# Patient Record
Sex: Male | Born: 1945 | Race: White | Hispanic: No | Marital: Married | Smoking: Never smoker
Health system: Southern US, Community
[De-identification: ages and names within clinical notes are randomized; demographics above are authoritative.]

## PROBLEM LIST (undated history)

## (undated) DIAGNOSIS — A4902 Methicillin resistant Staphylococcus aureus infection, unspecified site: Secondary | ICD-10-CM

## (undated) DIAGNOSIS — Z789 Other specified health status: Secondary | ICD-10-CM

## (undated) DIAGNOSIS — Z9289 Personal history of other medical treatment: Secondary | ICD-10-CM

## (undated) DIAGNOSIS — E781 Pure hyperglyceridemia: Secondary | ICD-10-CM

## (undated) DIAGNOSIS — Z85820 Personal history of malignant melanoma of skin: Secondary | ICD-10-CM

## (undated) DIAGNOSIS — M1990 Unspecified osteoarthritis, unspecified site: Secondary | ICD-10-CM

## (undated) DIAGNOSIS — I8393 Asymptomatic varicose veins of bilateral lower extremities: Secondary | ICD-10-CM

## (undated) HISTORY — DX: Unspecified osteoarthritis, unspecified site: M19.90

## (undated) HISTORY — PX: CATARACT EXTRACTION W/ INTRAOCULAR LENS IMPLANT: SHX1309

## (undated) HISTORY — DX: Pure hyperglyceridemia: E78.1

## (undated) HISTORY — DX: Asymptomatic varicose veins of bilateral lower extremities: I83.93

## (undated) HISTORY — DX: Personal history of malignant melanoma of skin: Z85.820

## (undated) HISTORY — DX: Methicillin resistant Staphylococcus aureus infection, unspecified site: A49.02

---
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
## 1995-10-25 HISTORY — PX: HERNIA REPAIR: SHX51

## 2004-10-24 DIAGNOSIS — C801 Malignant (primary) neoplasm, unspecified: Secondary | ICD-10-CM

## 2004-10-24 HISTORY — DX: Malignant (primary) neoplasm, unspecified: C80.1

## 2004-10-24 HISTORY — PX: MELANOMA EXCISION: SHX5266

## 2016-02-18 DIAGNOSIS — R6889 Other general symptoms and signs: Secondary | ICD-10-CM | POA: Diagnosis not present

## 2016-02-18 DIAGNOSIS — J309 Allergic rhinitis, unspecified: Secondary | ICD-10-CM | POA: Diagnosis not present

## 2016-02-18 DIAGNOSIS — R5383 Other fatigue: Secondary | ICD-10-CM | POA: Diagnosis not present

## 2016-02-18 DIAGNOSIS — Z13220 Encounter for screening for lipoid disorders: Secondary | ICD-10-CM | POA: Diagnosis not present

## 2016-02-18 DIAGNOSIS — J342 Deviated nasal septum: Secondary | ICD-10-CM | POA: Diagnosis not present

## 2016-02-18 DIAGNOSIS — Z125 Encounter for screening for malignant neoplasm of prostate: Secondary | ICD-10-CM | POA: Diagnosis not present

## 2016-09-12 DIAGNOSIS — Z23 Encounter for immunization: Secondary | ICD-10-CM | POA: Diagnosis not present

## 2017-02-17 DIAGNOSIS — R5383 Other fatigue: Secondary | ICD-10-CM | POA: Diagnosis not present

## 2017-02-17 DIAGNOSIS — Z1389 Encounter for screening for other disorder: Secondary | ICD-10-CM | POA: Diagnosis not present

## 2017-02-17 DIAGNOSIS — Z85820 Personal history of malignant melanoma of skin: Secondary | ICD-10-CM | POA: Diagnosis not present

## 2017-02-17 DIAGNOSIS — Z0000 Encounter for general adult medical examination without abnormal findings: Secondary | ICD-10-CM | POA: Diagnosis not present

## 2017-02-17 DIAGNOSIS — Z125 Encounter for screening for malignant neoplasm of prostate: Secondary | ICD-10-CM | POA: Diagnosis not present

## 2017-02-17 DIAGNOSIS — Z23 Encounter for immunization: Secondary | ICD-10-CM | POA: Diagnosis not present

## 2017-02-17 DIAGNOSIS — E781 Pure hyperglyceridemia: Secondary | ICD-10-CM | POA: Diagnosis not present

## 2017-03-14 DIAGNOSIS — B351 Tinea unguium: Secondary | ICD-10-CM | POA: Diagnosis not present

## 2017-03-14 DIAGNOSIS — L570 Actinic keratosis: Secondary | ICD-10-CM | POA: Diagnosis not present

## 2017-03-14 DIAGNOSIS — L820 Inflamed seborrheic keratosis: Secondary | ICD-10-CM | POA: Diagnosis not present

## 2017-03-14 DIAGNOSIS — Z85820 Personal history of malignant melanoma of skin: Secondary | ICD-10-CM | POA: Diagnosis not present

## 2017-06-14 DIAGNOSIS — Z1211 Encounter for screening for malignant neoplasm of colon: Secondary | ICD-10-CM | POA: Diagnosis not present

## 2017-06-14 DIAGNOSIS — Z1212 Encounter for screening for malignant neoplasm of rectum: Secondary | ICD-10-CM | POA: Diagnosis not present

## 2017-08-19 DIAGNOSIS — Z23 Encounter for immunization: Secondary | ICD-10-CM | POA: Diagnosis not present

## 2017-09-12 DIAGNOSIS — Z23 Encounter for immunization: Secondary | ICD-10-CM | POA: Diagnosis not present

## 2018-05-31 DIAGNOSIS — E669 Obesity, unspecified: Secondary | ICD-10-CM | POA: Diagnosis not present

## 2018-05-31 DIAGNOSIS — Z6831 Body mass index (BMI) 31.0-31.9, adult: Secondary | ICD-10-CM | POA: Diagnosis not present

## 2018-05-31 DIAGNOSIS — Z0001 Encounter for general adult medical examination with abnormal findings: Secondary | ICD-10-CM | POA: Diagnosis not present

## 2018-05-31 DIAGNOSIS — Z23 Encounter for immunization: Secondary | ICD-10-CM | POA: Diagnosis not present

## 2018-05-31 DIAGNOSIS — E781 Pure hyperglyceridemia: Secondary | ICD-10-CM | POA: Diagnosis not present

## 2018-05-31 DIAGNOSIS — R05 Cough: Secondary | ICD-10-CM | POA: Diagnosis not present

## 2018-05-31 DIAGNOSIS — Z85820 Personal history of malignant melanoma of skin: Secondary | ICD-10-CM | POA: Diagnosis not present

## 2018-05-31 DIAGNOSIS — Z1331 Encounter for screening for depression: Secondary | ICD-10-CM | POA: Diagnosis not present

## 2018-05-31 DIAGNOSIS — Z1389 Encounter for screening for other disorder: Secondary | ICD-10-CM | POA: Diagnosis not present

## 2018-05-31 DIAGNOSIS — Z1159 Encounter for screening for other viral diseases: Secondary | ICD-10-CM | POA: Diagnosis not present

## 2018-05-31 DIAGNOSIS — Z125 Encounter for screening for malignant neoplasm of prostate: Secondary | ICD-10-CM | POA: Diagnosis not present

## 2018-09-14 DIAGNOSIS — Z23 Encounter for immunization: Secondary | ICD-10-CM | POA: Diagnosis not present

## 2018-10-24 DIAGNOSIS — I499 Cardiac arrhythmia, unspecified: Secondary | ICD-10-CM

## 2018-10-24 HISTORY — DX: Cardiac arrhythmia, unspecified: I49.9

## 2019-10-22 DIAGNOSIS — E781 Pure hyperglyceridemia: Secondary | ICD-10-CM | POA: Diagnosis not present

## 2019-10-22 DIAGNOSIS — Z0001 Encounter for general adult medical examination with abnormal findings: Secondary | ICD-10-CM | POA: Diagnosis not present

## 2019-10-22 DIAGNOSIS — I499 Cardiac arrhythmia, unspecified: Secondary | ICD-10-CM | POA: Diagnosis not present

## 2019-10-22 DIAGNOSIS — Z85820 Personal history of malignant melanoma of skin: Secondary | ICD-10-CM | POA: Diagnosis not present

## 2019-10-22 DIAGNOSIS — Z7189 Other specified counseling: Secondary | ICD-10-CM | POA: Diagnosis not present

## 2019-10-25 HISTORY — PX: OTHER SURGICAL HISTORY: SHX169

## 2019-11-18 NOTE — Progress Notes (Signed)
Cardiology Office Note:    Date:  11/19/2019   ID:  Chase Knapp, DOB April 26, 1946, MRN [id number]  PCP:  Lind Guest, NP  Cardiologist:  Shirlee More, MD    Referring MD: Lind Guest, NP    ASSESSMENT:    1. Persistent atrial fibrillation (HCC)    PLAN:    In order of problems listed above:  1. He has age-indeterminate atrial fibrillation is asymptomatic.  Although he is risk score is relatively low at 1 it still favors anticoagulation and after review of options and benefits elects to start Xarelto once daily.  TSH is normal echocardiogram ordered I will review it if his atria are normal size we could consider an attempt to reestablish sinus rhythm but I have to be honest with the man I do not think he would feel better and would not improve the quality of his life and the likelihood of long-term success in these situations is low.  I encouraged him to continue his other health habits no restrictions in his exercise with cycling. 2. For triglyceridemia his recent lipid profile shows a normal triglyceride level.  This time would not advise pharmacologic therapy   Next appointment: 6 weeks   Medication Adjustments/Labs and Tests Ordered: Current medicines are reviewed at length with the patient today.  Concerns regarding medicines are outlined above.  No orders of the defined types were placed in this encounter.  No orders of the defined types were placed in this encounter.   No chief complaint on file.   History of Present Illness:    Chase Knapp is a 74 y.o. male with a hx of recently identified atrial fibrillation with a controlled ventricular rate on EKG 10/22/2019 referred by Haydee Salter nurse practitioner.  His stroke risk is low and the only identifiable risk factor is his age. Compliance with diet, lifestyle and medications: Yes  He retired early life and is very vigorous active man and an avid cyclist.  He has noticed no change in his exercise tolerance  palpitations syncope TIA shortness of breath or edema and no history of heart disease congenital or rheumatic.  He has good healthcare literacy I reviewed with him his increased risk of stroke and current recommendations favor anticoagulation over antiplatelet.  He agrees and will start once a day Xarelto.  Rate studies are normal no alcohol abuse or sleep apnea and he will undergo echocardiogram and reassess in the office in 6 weeks Past Medical History:  Diagnosis Date  . History of melanoma   . Hypertriglyceridemia   . MRSA infection    Right knee prolonged IV antibiotics  . Osteoarthritis   . Varicose veins of both lower extremities     Past Surgical History:  Procedure Laterality Date  . APPENDECTOMY    . HERNIA REPAIR      Current Medications: No outpatient medications have been marked as taking for the 11/19/19 encounter (Office Visit) with Richardo Priest, MD.     Allergies:   Patient has no known allergies.   Social History   Socioeconomic History  . Marital status: Unknown    Spouse name: Not on file  . Number of children: Not on file  . Years of education: Not on file  . Highest education level: Not on file  Occupational History  . Not on file  Tobacco Use  . Smoking status: Never Smoker  . Smokeless tobacco: Never Used  Substance and Sexual Activity  . Alcohol use: Never  .  Drug use: Not on file  . Sexual activity: Not on file  Other Topics Concern  . Not on file  Social History Narrative  . Not on file   Social Determinants of Health   Financial Resource Strain:   . Difficulty of Paying Living Expenses: Not on file  Food Insecurity:   . Worried About Charity fundraiser in the Last Year: Not on file  . Ran Out of Food in the Last Year: Not on file  Transportation Needs:   . Lack of Transportation (Medical): Not on file  . Lack of Transportation (Non-Medical): Not on file  Physical Activity:   . Days of Exercise per Week: Not on file  . Minutes of  Exercise per Session: Not on file  Stress:   . Feeling of Stress : Not on file  Social Connections:   . Frequency of Communication with Friends and Family: Not on file  . Frequency of Social Gatherings with Friends and Family: Not on file  . Attends Religious Services: Not on file  . Active Member of Clubs or Organizations: Not on file  . Attends Archivist Meetings: Not on file  . Marital Status: Not on file     Family History:  Family history of atrial fibrillation ROS: Review of Systems  Constitution: Negative.  HENT: Negative.   Eyes: Negative.   Cardiovascular: Negative.   Respiratory: Negative.   Endocrine: Negative.   Hematologic/Lymphatic: Negative.   Skin: Negative.   Musculoskeletal: Negative.   Gastrointestinal: Negative.   Genitourinary: Negative.   Neurological: Negative.   Psychiatric/Behavioral: Negative.   Allergic/Immunologic: Negative.     Please see the history of present illness.    All other systems reviewed and are negative.  EKGs/Labs/Other Studies Reviewed:    The following studies were reviewed today:  EKG:  EKG ordered today and personally reviewed.  The ekg ordered today demonstrates atrial fibrillation controlled ventricular rate otherwise normal EKG  Recent Labs: Supplied by his primary care physician: TSH normal 0.76 Hemoglobin 14.3 platelets 179,000 CMP is normal including liver function renal function potassium 4.7 creatinine 0.96 GFR 78 cc Cholesterol 210 triglycerides 119 HDL 47 LDL 142  Physical Exam:    VS:  BP 128/84   Pulse 79   Ht 5' 7.5" (1.715 m)   Wt 209 lb (94.8 kg)   SpO2 95%   BMI 32.25 kg/m     Wt Readings from Last 3 Encounters:  11/19/19 209 lb (94.8 kg)     GEN: He looks younger than his age quite healthy appearing well nourished, well developed in no acute distress HEENT: Normal NECK: No JVD; No carotid bruits LYMPHATICS: No lymphadenopathy CARDIAC: Irregular rhythm S1 variable , no murmurs,  rubs, gallops RESPIRATORY:  Clear to auscultation without rales, wheezing or rhonchi  ABDOMEN: Soft, non-tender, non-distended MUSCULOSKELETAL:  No edema; No deformity  SKIN: Warm and dry NEUROLOGIC:  Alert and oriented x 3 PSYCHIATRIC:  Normal affect    Signed, Shirlee More, MD  11/19/2019 1:46 PM     [facility]

## 2019-11-19 ENCOUNTER — Ambulatory Visit (INDEPENDENT_AMBULATORY_CARE_PROVIDER_SITE_OTHER): Payer: Medicare Other | Admitting: Cardiology

## 2019-11-19 ENCOUNTER — Encounter: Payer: Self-pay | Admitting: Cardiology

## 2019-11-19 ENCOUNTER — Other Ambulatory Visit: Payer: Self-pay

## 2019-11-19 VITALS — BP 128/84 | HR 79 | Ht 67.5 in | Wt 209.0 lb

## 2019-11-19 DIAGNOSIS — I4819 Other persistent atrial fibrillation: Secondary | ICD-10-CM | POA: Diagnosis not present

## 2019-11-19 MED ORDER — RIVAROXABAN 20 MG PO TABS: 20.0000 mg | ORAL_TABLET | Freq: Every day | ORAL | 3 refills | Status: DC

## 2019-11-19 NOTE — Patient Instructions (Signed)
Medication Instructions:  START Rivaroxaban (XARELTO) 20 mg once day   *If you need a refill on your cardiac medications before your next appointment, please call your pharmacy*  Lab Work: None ordered   If you have labs (blood work) drawn today and your tests are completely normal, you will receive your results only by: Marland Kitchen MyChart Message (if you have MyChart) OR . A paper copy in the mail If you have any lab test that is abnormal or we need to change your treatment, we will call you to review the results.  Testing/Procedures: Your physician has requested that you have an echocardiogram. Echocardiography is a painless test that uses sound waves to create images of your heart. It provides your doctor with information about the size and shape of your heart and how well your heart's chambers and valves are working. This procedure takes approximately one hour. There are no restrictions for this procedure.    Follow-Up: At [facility], you and your health needs are our priority.  As part of our continuing mission to provide you with exceptional heart care, we have created designated Provider Care Teams.  These Care Teams include your primary Cardiologist (physician) and Advanced Practice Providers (APPs -  Physician Assistants and Nurse Practitioners) who all work together to provide you with the care you need, when you need it.  Your next appointment:   6 week(s)  The format for your next appointment:   In Person  Provider:   Shirlee More, MD  Other Instructions None

## 2019-11-20 ENCOUNTER — Other Ambulatory Visit: Payer: Self-pay | Admitting: Cardiology

## 2019-11-20 MED ORDER — RIVAROXABAN 20 MG PO TABS: 20.0000 mg | ORAL_TABLET | Freq: Every day | ORAL | 3 refills | Status: DC

## 2019-11-20 NOTE — Telephone Encounter (Signed)
PATIENT came into office and needs meds called to another pharmacy, he gave the wrong one.Marland Kitchen He needs to have called to the [facility] on [address] in [place], please call in any  Prescriptions that we sent yesterday to the [facility] for him and change the pharmacy preference to them also.

## 2019-11-20 NOTE — Telephone Encounter (Signed)
Rx refill sent to pharmacy. Refill sent to new pharmacy, other was sent to wrong pharmacy.

## 2019-11-20 NOTE — Addendum Note (Signed)
Addended by: Anselm Pancoast on: 11/20/2019 04:58 PM   Modules accepted: Orders

## 2020-01-07 ENCOUNTER — Other Ambulatory Visit: Payer: Medicare Other

## 2020-01-10 ENCOUNTER — Ambulatory Visit: Payer: Medicare Other | Admitting: Cardiology

## 2020-01-15 DIAGNOSIS — I371 Nonrheumatic pulmonary valve insufficiency: Secondary | ICD-10-CM | POA: Diagnosis not present

## 2020-01-15 DIAGNOSIS — I4891 Unspecified atrial fibrillation: Secondary | ICD-10-CM | POA: Diagnosis not present

## 2020-01-15 DIAGNOSIS — I499 Cardiac arrhythmia, unspecified: Secondary | ICD-10-CM | POA: Diagnosis not present

## 2020-01-15 DIAGNOSIS — I517 Cardiomegaly: Secondary | ICD-10-CM | POA: Diagnosis not present

## 2020-02-07 DIAGNOSIS — Z7901 Long term (current) use of anticoagulants: Secondary | ICD-10-CM | POA: Diagnosis not present

## 2020-02-07 DIAGNOSIS — I4819 Other persistent atrial fibrillation: Secondary | ICD-10-CM | POA: Diagnosis not present

## 2020-02-07 DIAGNOSIS — I4891 Unspecified atrial fibrillation: Secondary | ICD-10-CM | POA: Diagnosis not present

## 2020-02-07 DIAGNOSIS — Z20822 Contact with and (suspected) exposure to covid-19: Secondary | ICD-10-CM | POA: Diagnosis not present

## 2020-02-07 DIAGNOSIS — Z01810 Encounter for preprocedural cardiovascular examination: Secondary | ICD-10-CM | POA: Diagnosis not present

## 2020-02-10 DIAGNOSIS — Z7901 Long term (current) use of anticoagulants: Secondary | ICD-10-CM | POA: Diagnosis not present

## 2020-02-10 DIAGNOSIS — I4819 Other persistent atrial fibrillation: Secondary | ICD-10-CM | POA: Diagnosis not present

## 2020-02-11 DIAGNOSIS — Z9889 Other specified postprocedural states: Secondary | ICD-10-CM | POA: Diagnosis not present

## 2020-02-11 DIAGNOSIS — Z7901 Long term (current) use of anticoagulants: Secondary | ICD-10-CM | POA: Diagnosis not present

## 2020-02-11 DIAGNOSIS — Z8679 Personal history of other diseases of the circulatory system: Secondary | ICD-10-CM | POA: Diagnosis not present

## 2020-02-11 DIAGNOSIS — I4891 Unspecified atrial fibrillation: Secondary | ICD-10-CM | POA: Diagnosis not present

## 2020-02-11 DIAGNOSIS — I4819 Other persistent atrial fibrillation: Secondary | ICD-10-CM | POA: Diagnosis not present

## 2020-04-16 DIAGNOSIS — I4819 Other persistent atrial fibrillation: Secondary | ICD-10-CM | POA: Diagnosis not present

## 2020-04-30 DIAGNOSIS — I712 Thoracic aortic aneurysm, without rupture: Secondary | ICD-10-CM | POA: Diagnosis not present

## 2020-04-30 DIAGNOSIS — Z7901 Long term (current) use of anticoagulants: Secondary | ICD-10-CM | POA: Diagnosis not present

## 2020-04-30 DIAGNOSIS — I4819 Other persistent atrial fibrillation: Secondary | ICD-10-CM | POA: Diagnosis not present

## 2020-04-30 DIAGNOSIS — Z01810 Encounter for preprocedural cardiovascular examination: Secondary | ICD-10-CM | POA: Diagnosis not present

## 2020-04-30 DIAGNOSIS — I491 Atrial premature depolarization: Secondary | ICD-10-CM | POA: Diagnosis not present

## 2020-04-30 DIAGNOSIS — I440 Atrioventricular block, first degree: Secondary | ICD-10-CM | POA: Diagnosis not present

## 2020-04-30 DIAGNOSIS — Z8679 Personal history of other diseases of the circulatory system: Secondary | ICD-10-CM | POA: Diagnosis not present

## 2020-04-30 DIAGNOSIS — Z9889 Other specified postprocedural states: Secondary | ICD-10-CM | POA: Diagnosis not present

## 2020-04-30 DIAGNOSIS — I4891 Unspecified atrial fibrillation: Secondary | ICD-10-CM | POA: Diagnosis not present

## 2020-06-27 DIAGNOSIS — I482 Chronic atrial fibrillation, unspecified: Secondary | ICD-10-CM | POA: Diagnosis not present

## 2020-07-05 DIAGNOSIS — I4819 Other persistent atrial fibrillation: Secondary | ICD-10-CM | POA: Diagnosis not present

## 2020-08-13 DIAGNOSIS — I4819 Other persistent atrial fibrillation: Secondary | ICD-10-CM | POA: Diagnosis not present

## 2020-10-20 ENCOUNTER — Other Ambulatory Visit: Payer: Self-pay | Admitting: Cardiology

## 2020-10-22 DIAGNOSIS — Z0001 Encounter for general adult medical examination with abnormal findings: Secondary | ICD-10-CM | POA: Diagnosis not present

## 2020-10-22 DIAGNOSIS — E781 Pure hyperglyceridemia: Secondary | ICD-10-CM | POA: Diagnosis not present

## 2020-11-19 DIAGNOSIS — Z1211 Encounter for screening for malignant neoplasm of colon: Secondary | ICD-10-CM | POA: Diagnosis not present

## 2020-11-19 DIAGNOSIS — Z1212 Encounter for screening for malignant neoplasm of rectum: Secondary | ICD-10-CM | POA: Diagnosis not present

## 2021-02-20 DIAGNOSIS — I4819 Other persistent atrial fibrillation: Secondary | ICD-10-CM | POA: Diagnosis not present

## 2021-03-01 DIAGNOSIS — I4819 Other persistent atrial fibrillation: Secondary | ICD-10-CM | POA: Diagnosis not present

## 2021-03-04 DIAGNOSIS — I712 Thoracic aortic aneurysm, without rupture: Secondary | ICD-10-CM | POA: Diagnosis not present

## 2021-03-04 DIAGNOSIS — I4819 Other persistent atrial fibrillation: Secondary | ICD-10-CM | POA: Diagnosis not present

## 2021-04-07 DIAGNOSIS — M1712 Unilateral primary osteoarthritis, left knee: Secondary | ICD-10-CM | POA: Diagnosis not present

## 2021-04-07 DIAGNOSIS — M170 Bilateral primary osteoarthritis of knee: Secondary | ICD-10-CM | POA: Diagnosis not present

## 2021-07-21 DIAGNOSIS — M1611 Unilateral primary osteoarthritis, right hip: Secondary | ICD-10-CM | POA: Diagnosis not present

## 2021-08-18 DIAGNOSIS — Z8679 Personal history of other diseases of the circulatory system: Secondary | ICD-10-CM | POA: Diagnosis not present

## 2021-08-18 DIAGNOSIS — Z01818 Encounter for other preprocedural examination: Secondary | ICD-10-CM | POA: Diagnosis not present

## 2021-08-18 DIAGNOSIS — M1611 Unilateral primary osteoarthritis, right hip: Secondary | ICD-10-CM | POA: Diagnosis not present

## 2021-09-09 NOTE — Patient Instructions (Addendum)
DUE TO COVID-19 ONLY ONE VISITOR IS ALLOWED TO COME WITH YOU AND STAY IN THE WAITING ROOM ONLY DURING PRE OP AND PROCEDURE DAY OF SURGERY IF YOU ARE GOING HOME AFTER SURGERY. IF YOU ARE SPENDING THE NIGHT 2 PEOPLE MAY VISIT WITH YOU IN YOUR PRIVATE ROOM AFTER SURGERY UNTIL VISITING  HOURS ARE OVER AT 8:00 PM AND 1  VISITOR  MAY  SPEND THE NIGHT.                 Chase Knapp     Your procedure is scheduled on: 09/21/21   Report to [facility] Main  Entrance   Report to admitting at 2:20 PM     Call this number if you have problems the morning of surgery [phone]    No food after midnight.    You may have clear liquid until 1:30 PM.    At 1:00 PM drink pre surgery drink  . Nothing by mouth after 1:30 PM   CLEAR LIQUID DIET   Foods Allowed                                                                     Foods Excluded  Coffee and tea, regular and decaf  No milk or creamer                                                    liquids that you cannot  Plain Jell-O any favor except red or purple                                           see through such as: Fruit ices (not with fruit pulp)                                     milk, soups, orange juice  Iced Popsicles                                    All solid food Carbonated beverages, regular and diet                                    Cranberry, grape and apple juices Sports drinks like Gatorade Lightly seasoned clear broth or consume(fat free) Sugar   BRUSH YOUR TEETH MORNING OF SURGERY AND RINSE YOUR MOUTH OUT, NO CHEWING GUM CANDY OR MINTS.     Take these medicines the morning of surgery with A SIP OF WATER: none                                You may not have any metal on your body including  piercings  Do not wear jewelry,  lotions, powders or deodorant             Men may shave face and neck.   Do not bring valuables to the hospital. [place].  Contacts, dentures or bridgework may not be worn into surgery.              [place] - Preparing for Surgery Before surgery, you can play an important role.  Because skin is not sterile, your skin needs to be as free of germs as possible.  You can reduce the number of germs on your skin by washing with CHG (chlorahexidine gluconate) soap before surgery.  CHG is an antiseptic cleaner which kills germs and bonds with the skin to continue killing germs even after washing. Please DO NOT use if you have an allergy to CHG or antibacterial soaps.  If your skin becomes reddened/irritated stop using the CHG and inform your nurse when you arrive at Short Stay.  You may shave your face/neck. Please follow these instructions carefully:  1.  Shower with CHG Soap the night before surgery and the  morning of Surgery.  2.  If you choose to wash your hair, wash your hair first as usual with your  normal  shampoo.  3.  After you shampoo, rinse your hair and body thoroughly to remove the  shampoo.                            4.  Use CHG as you would any other liquid soap.  You can apply chg directly  to the skin and wash                       Gently with a scrungie or clean washcloth.  5.  Apply the CHG Soap to your body ONLY FROM THE NECK DOWN.   Do not use on face/ open                           Wound or open sores. Avoid contact with eyes, ears mouth and genitals (private parts).                       Wash face,  Genitals (private parts) with your normal soap.             6.  Wash thoroughly, paying special attention to the area where your surgery  will be performed.  7.  Thoroughly rinse your body with warm water from the neck down.  8.  DO NOT shower/wash with your normal soap after using and rinsing off  the CHG Soap.                9.  Pat yourself dry with a clean towel.            10.  Wear clean pajamas.            11.  Place clean sheets on your bed the night of your first shower and do  not  sleep with pets. Day of Surgery : Do not apply any lotions/deodorants the morning of surgery.  Please wear clean clothes to the hospital/surgery center.  FAILURE TO FOLLOW THESE INSTRUCTIONS MAY RESULT IN THE CANCELLATION OF  YOUR SURGERY PATIENT SIGNATURE_________________________________  NURSE SIGNATURE__________________________________  ________________________________________________________________________   Chase Knapp  An incentive spirometer is a tool that can help keep your lungs clear and active. This tool measures how well you are filling your lungs with each breath. Taking long deep breaths may help reverse or decrease the chance of developing breathing (pulmonary) problems (especially infection) following: A long period of time when you are unable to move or be active. BEFORE THE PROCEDURE  If the spirometer includes an indicator to show your best effort, your nurse or respiratory therapist will set it to a desired goal. If possible, sit up straight or lean slightly forward. Try not to slouch. Hold the incentive spirometer in an upright position. INSTRUCTIONS FOR USE  Sit on the edge of your bed if possible, or sit up as far as you can in bed or on a chair. Hold the incentive spirometer in an upright position. Breathe out normally. Place the mouthpiece in your mouth and seal your lips tightly around it. Breathe in slowly and as deeply as possible, raising the piston or the ball toward the top of the column. Hold your breath for 3-5 seconds or for as long as possible. Allow the piston or ball to fall to the bottom of the column. Remove the mouthpiece from your mouth and breathe out normally. Rest for a few seconds and repeat Steps 1 through 7 at least 10 times every 1-2 hours when you are awake. Take your time and take a few normal breaths between deep breaths. The spirometer may include an indicator to show your best effort. Use the indicator as a goal to work  toward during each repetition. After each set of 10 deep breaths, practice coughing to be sure your lungs are clear. If you have an incision (the cut made at the time of surgery), support your incision when coughing by placing a pillow or rolled up towels firmly against it. Once you are able to get out of bed, walk around indoors and cough well. You may stop using the incentive spirometer when instructed by your caregiver.  RISKS AND COMPLICATIONS Take your time so you do not get dizzy or light-headed. If you are in pain, you may need to take or ask for pain medication before doing incentive spirometry. It is harder to take a deep breath if you are having pain. AFTER USE Rest and breathe slowly and easily. It can be helpful to keep track of a log of your progress. Your caregiver can provide you with a simple table to help with this. If you are using the spirometer at home, follow these instructions: [place] IF:  You are having difficultly using the spirometer. You have trouble using the spirometer as often as instructed. Your pain medication is not giving enough relief while using the spirometer. You develop fever of 100.5 F (38.1 C) or higher. SEEK IMMEDIATE MEDICAL CARE IF:  You cough up bloody sputum that had not been present before. You develop fever of 102 F (38.9 C) or greater. You develop worsening pain at or near the incision site. MAKE SURE YOU:  Understand these instructions. Will watch your condition. Will get help right away if you are not doing well or get worse. Document Released: 02/20/2007 Document Revised: 01/02/2012 Document Reviewed: 04/23/2007 [facility] Patient Information 2014 [employer], [place].   ________________________________________________________________________ pcr

## 2021-09-10 ENCOUNTER — Other Ambulatory Visit: Payer: Self-pay

## 2021-09-10 ENCOUNTER — Encounter (HOSPITAL_COMMUNITY)
Admission: RE | Admit: 2021-09-10 | Discharge: 2021-09-10 | Disposition: A | Discharge status: Home / Self Care | Payer: Medicare Other | Source: Ambulatory Visit | Attending: Orthopedic Surgery | Admitting: Orthopedic Surgery

## 2021-09-10 ENCOUNTER — Encounter (HOSPITAL_COMMUNITY): Payer: Self-pay

## 2021-09-10 VITALS — BP 156/85 | HR 51 | Temp 97.6°F | Resp 20 | Ht 67.5 in | Wt 200.0 lb

## 2021-09-10 DIAGNOSIS — I77819 Aortic ectasia, unspecified site: Secondary | ICD-10-CM | POA: Insufficient documentation

## 2021-09-10 DIAGNOSIS — I351 Nonrheumatic aortic (valve) insufficiency: Secondary | ICD-10-CM | POA: Diagnosis not present

## 2021-09-10 DIAGNOSIS — I4891 Unspecified atrial fibrillation: Secondary | ICD-10-CM | POA: Diagnosis not present

## 2021-09-10 DIAGNOSIS — Z01812 Encounter for preprocedural laboratory examination: Secondary | ICD-10-CM | POA: Diagnosis not present

## 2021-09-10 DIAGNOSIS — M1611 Unilateral primary osteoarthritis, right hip: Secondary | ICD-10-CM | POA: Diagnosis not present

## 2021-09-10 DIAGNOSIS — Z01818 Encounter for other preprocedural examination: Secondary | ICD-10-CM

## 2021-09-10 LAB — COMPREHENSIVE METABOLIC PANEL
ALT: 21 U/L (ref 0–44)
AST: 23 U/L (ref 15–41)
Albumin: 4.2 g/dL (ref 3.5–5.0)
Alkaline Phosphatase: 61 U/L (ref 38–126)
Anion gap: 4 — ABNORMAL LOW (ref 5–15)
BUN: 23 mg/dL (ref 8–23)
CO2: 27 mmol/L (ref 22–32)
Calcium: 9.2 mg/dL (ref 8.9–10.3)
Chloride: 106 mmol/L (ref 98–111)
Creatinine, Ser: 0.82 mg/dL (ref 0.61–1.24)
GFR, Estimated: >60 mL/min (ref >60)
Glucose, Bld: 96 mg/dL (ref 70–99)
Potassium: 4.8 mmol/L (ref 3.5–5.1)
Sodium: 137 mmol/L (ref 135–145)
Total Bilirubin: 1.1 mg/dL (ref 0.3–1.2)
Total Protein: 6.9 g/dL (ref 6.5–8.1)

## 2021-09-10 LAB — CBC
HCT: 40.7 % (ref 39.0–52.0)
Hemoglobin: 13.8 g/dL (ref 13.0–17.0)
MCH: 31.8 pg (ref 26.0–34.0)
MCHC: 33.9 g/dL (ref 30.0–36.0)
MCV: 93.8 fL (ref 80.0–100.0)
Platelets: 193 10*3/uL (ref 150–400)
RBC: 4.34 MIL/uL (ref 4.22–5.81)
RDW: 12.4 % (ref 11.5–15.5)
WBC: 5.7 10*3/uL (ref 4.0–10.5)
nRBC: 0 % (ref 0.0–0.2)

## 2021-09-10 LAB — SURGICAL PCR SCREEN
MRSA, PCR: NEGATIVE
Staphylococcus aureus: NEGATIVE

## 2021-09-10 LAB — TYPE AND SCREEN
ABO/RH(D): A POS
Antibody Screen: NEGATIVE

## 2021-09-10 NOTE — Progress Notes (Signed)
COVID test -DOS  PCP - Dr. Elouise Munroe  internal med [place] Cardiologist - Dr. Seward Speck - [facility]  Chest x-ray - no EKG - 03/04/21 CE- requested 09/10/21 Stress Test - no ECHO - 03/04/21-CE Cardiac Cath - no Pacemaker/ICD device last checked:NA  Sleep Study - no CPAP -   Fasting Blood Sugar - NA Checks Blood Sugar _____ times a day  Blood Thinner Instructions:NA Aspirin Instructions: Last Dose:  Anesthesia review: yes  Patient denies shortness of breath, fever, cough and chest pain at PAT appointment Pt had A-fib then an unsuccessful ablation followed by a cardioversion and hasn't has problems since. No SOB with activities  Patient verbalized understanding of instructions that were given to them at the PAT appointment. Patient was also instructed that they will need to review over the PAT instructions again at home before surgery. yes

## 2021-09-15 NOTE — Progress Notes (Signed)
Anesthesia Chart Review   Case: [id number] Date/Time: 09/21/21 1550   Procedure: TOTAL HIP ARTHROPLASTY ANTERIOR APPROACH (Right: Hip)   Anesthesia type: Spinal   Pre-op diagnosis: Right hip osteoarthritis   Location: [facility] ROOM 09 / [facility]   Surgeons: Paralee Cancel, MD       DISCUSSION:75 y.o. never smoker with h/o A-fib, right hip OA scheduled for above procedure 09/21/2021 with Dr. Paralee Cancel.   Clearance from cardiology on chart which states pt is low risk for planned procedure.  He is not on anticoagulant.   Anticipate pt can proceed with planned procedure barring acute status change.   VS: BP (!) 156/85   Pulse (!) 51   Temp 36.4 C (Oral)   Resp 20   Ht 5' 7.5" (1.715 m)   Wt 90.7 kg   SpO2 99%   BMI 30.86 kg/m   PROVIDERS: Haydee Salter, NP is PCP   Norm Salt, MD is Cardiologist  LABS: Labs reviewed: Acceptable for surgery. (all labs ordered are listed, but only abnormal results are displayed)  Labs Reviewed  COMPREHENSIVE METABOLIC PANEL - Abnormal; Notable for the following components:      Result Value   Anion gap 4 (*)    All other components within normal limits  SURGICAL PCR SCREEN  CBC  TYPE AND SCREEN     IMAGES:   EKG: On chart   CV: Echo 03/04/2021 INTERPRETATION ---------------------------------------------------------------    NORMAL LEFT VENTRICULAR SYSTOLIC FUNCTION    NORMAL LA PRESSURES WITH NORMAL DIASTOLIC FUNCTION    NORMAL RIGHT VENTRICULAR SYSTOLIC FUNCTION    VALVULAR REGURGITATION: MILD AR, TRIVIAL PR    NO VALVULAR STENOSIS    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)     Compared with prior Echo study on 01/15/2020: NO SIGNIFICANT CHANGE Past Medical History:  Diagnosis Date   Cancer ([place]) 2006   skin on Lt upper arm   Dysrhythmia 2020   A-Fib   Hypertriglyceridemia    MRSA infection    Right knee prolonged IV antibiotics   Osteoarthritis    Varicose veins of both lower extremities     Past Surgical History:   Procedure Laterality Date   [place] Left 2006   arm    MEDICATIONS:  Ascorbic Acid (VITAMIN C PO)   Cholecalciferol (VITAMIN D3 PO)   diclofenac (VOLTAREN) 75 MG EC tablet   glucosamine-chondroitin 500-400 MG tablet   Multiple Vitamins-Minerals (MULTIVITAMIN WITH MINERALS) tablet   Omega-3 Fatty Acids (FISH OIL PO)   vitamin E 180 MG (400 UNITS) capsule   XARELTO 20 MG TABS tablet   zinc gluconate 50 MG tablet   No current facility-administered medications for this encounter.   Konrad Felix Ward, PA-C [facility] Pre-Surgical Testing [phone]

## 2021-09-15 NOTE — Anesthesia Preprocedure Evaluation (Addendum)
Anesthesia Evaluation  Patient identified by MRN, date of birth, ID band Patient awake    Reviewed: Allergy & Precautions, NPO status , Patient's Chart, lab work & pertinent test results  Airway Mallampati: II  TM Distance: >3 FB Neck ROM: Full    Dental  (+) Dental Advisory Given, Partial Lower, Partial Upper   Pulmonary neg pulmonary ROS,    Pulmonary exam normal breath sounds clear to auscultation       Cardiovascular Exercise Tolerance: Good (-) hypertension(-) angina(-) Past MI Normal cardiovascular exam+ dysrhythmias Atrial Fibrillation  Rhythm:Regular Rate:Normal     Neuro/Psych negative neurological ROS  negative psych ROS   GI/Hepatic negative GI ROS, Neg liver ROS,   Endo/Other  Obesity   Renal/GU negative Renal ROS     Musculoskeletal  (+) Arthritis , Right hip osteoarthritis   Abdominal   Peds  Hematology negative hematology ROS (+) Plt 193k   Anesthesia Other Findings Day of surgery medications reviewed with the patient.  Reproductive/Obstetrics                            Anesthesia Physical Anesthesia Plan  ASA: 3  Anesthesia Plan: Spinal   Post-op Pain Management:    Induction: Intravenous  PONV Risk Score and Plan: 1 and Dexamethasone and Ondansetron  Airway Management Planned: Natural Airway and Nasal Cannula  Additional Equipment:   Intra-op Plan:   Post-operative Plan:   Informed Consent: I have reviewed the patients History and Physical, chart, labs and discussed the procedure including the risks, benefits and alternatives for the proposed anesthesia with the patient or authorized representative who has indicated his/her understanding and acceptance.     Dental advisory given  Plan Discussed with: CRNA, Anesthesiologist and Surgeon  Anesthesia Plan Comments: (See PAT note 09/10/2021, Konrad Felix Ward, PA-C)       Anesthesia Quick  Evaluation

## 2021-09-20 NOTE — H&P (Signed)
TOTAL HIP ADMISSION H&P  Patient is admitted for right total hip arthroplasty.  Subjective:  Chief Complaint: right hip pain  HPI: Lenox Bink, 75 y.o. male, has a history of pain and functional disability in the right hip(s) due to arthritis and patient has failed non-surgical conservative treatments for greater than 12 weeks to include NSAID's and/or analgesics and activity modification.  Onset of symptoms was gradual starting 2 years ago with gradually worsening course since that time.The patient noted no past surgery on the right hip(s).  Patient currently rates pain in the right hip at 8 out of 10 with activity. Patient has worsening of pain with activity and weight bearing, pain that interfers with activities of daily living, and pain with passive range of motion. Patient has evidence of joint space narrowing by imaging studies. This condition presents safety issues increasing the risk of falls. .  There is no current active infection.  There are no problems to display for this patient.  Past Medical History:  Diagnosis Date   Cancer ([place]) 2006   skin on Lt upper arm   Dysrhythmia 2020   A-Fib   Hypertriglyceridemia    MRSA infection    Right knee prolonged IV antibiotics   Osteoarthritis    Varicose veins of both lower extremities     Past Surgical History:  Procedure Laterality Date   [place] Left 2006   arm    No current facility-administered medications for this encounter.   Current Outpatient Medications  Medication Sig Dispense Refill Last Dose   Ascorbic Acid (VITAMIN C PO) Take 1 tablet by mouth 3 (three) times a week.      Cholecalciferol (VITAMIN D3 PO) Take 1 tablet by mouth daily.      diclofenac (VOLTAREN) 75 MG EC tablet Take 75 mg by mouth 2 (two) times daily.      glucosamine-chondroitin 500-400 MG tablet Take 1 tablet by mouth 3 (three) times a week.      Multiple Vitamins-Minerals (MULTIVITAMIN WITH  MINERALS) tablet Take 1 tablet by mouth daily.      Omega-3 Fatty Acids (FISH OIL PO) Take 1 capsule by mouth 3 (three) times a week.      vitamin E 180 MG (400 UNITS) capsule Take 400 Units by mouth 3 (three) times a week.      zinc gluconate 50 MG tablet Take 50 mg by mouth 3 (three) times a week.      XARELTO 20 MG TABS tablet TAKE 1 TABLET(20 MG) BY MOUTH DAILY WITH SUPPER (Patient not taking: Reported on 09/07/2021) 90 tablet 3 Not Taking   Allergies  Allergen Reactions   Ibuprofen Itching and Rash    Social History   Tobacco Use   Smoking status: Never   Smokeless tobacco: Never  Substance Use Topics   Alcohol use: Never    No family history on file.   Review of Systems  Constitutional:  Negative for chills and fever.  Respiratory:  Negative for cough and shortness of breath.   Cardiovascular:  Negative for chest pain.  Gastrointestinal:  Negative for nausea and vomiting.  Musculoskeletal:  Positive for arthralgias.    Objective:  Physical Exam Well nourished and well developed. General: Alert and oriented x3, cooperative and pleasant, no acute distress. Head: normocephalic, atraumatic, neck supple. Eyes: EOMI.  Musculoskeletal: Right hip exam: Painful and limited hip flexion internal rotation to 5 degrees with pelvic tilting External rotation  to 20 degrees Neurovascular intact distally No significant knee effusion Some tenderness at the right knee  Calves soft and nontender. Motor function intact in LE. Strength 5/5 LE bilaterally. Neuro: Distal pulses 2+. Sensation to light touch intact in LE.  Vital signs in last 24 hours:    Labs:   Estimated body mass index is 30.86 kg/m as calculated from the following:   Height as of 09/10/21: 5' 7.5" (1.715 m).   Weight as of 09/10/21: 90.7 kg.   Imaging Review Plain radiographs demonstrate severe degenerative joint disease of the right hip(s). The bone quality appears to be adequate for age and reported  activity level.      Assessment/Plan:  End stage arthritis, right hip(s)  The patient history, physical examination, clinical judgement of the provider and imaging studies are consistent with end stage degenerative joint disease of the right hip(s) and total hip arthroplasty is deemed medically necessary. The treatment options including medical management, injection therapy, arthroscopy and arthroplasty were discussed at length. The risks and benefits of total hip arthroplasty were presented and reviewed. The risks due to aseptic loosening, infection, stiffness, dislocation/subluxation,  thromboembolic complications and other imponderables were discussed.  The patient acknowledged the explanation, agreed to proceed with the plan and consent was signed. Patient is being admitted for inpatient treatment for surgery, pain control, PT, OT, prophylactic antibiotics, VTE prophylaxis, progressive ambulation and ADL's and discharge planning.The patient is planning to be discharged  home.  Therapy Plans: HEP Disposition: Home with wife Planned DVT Prophylaxis: aspirin 81mg  BID DME needed: none PCP: Dr. Laqueta Due, clearance received Electophysiologist: Dr. Norm Salt, clearance received TXA: IV Allergies: aleve - rash Anesthesia Concerns: none BMI: 30.7 Last HgbA1c: Not diabetic  Other: - Hx of a fib, s/p ablation and subsequent cardioversion - not on anticoagulants currently - Norco, tylenol, robaxin, celebrex   Costella Hatcher, PA-C Orthopedic Surgery [facility] [place] [phone]

## 2021-09-21 ENCOUNTER — Ambulatory Visit (HOSPITAL_COMMUNITY): Payer: Medicare Other | Admitting: Anesthesiology

## 2021-09-21 ENCOUNTER — Encounter (HOSPITAL_COMMUNITY): Payer: Self-pay | Admitting: Orthopedic Surgery

## 2021-09-21 ENCOUNTER — Observation Stay (HOSPITAL_COMMUNITY): Payer: Medicare Other

## 2021-09-21 ENCOUNTER — Ambulatory Visit (HOSPITAL_COMMUNITY): Payer: Medicare Other

## 2021-09-21 ENCOUNTER — Ambulatory Visit (HOSPITAL_COMMUNITY): Payer: Medicare Other | Admitting: Physician Assistant

## 2021-09-21 ENCOUNTER — Observation Stay (HOSPITAL_COMMUNITY)
Admission: RE | Admit: 2021-09-21 | Discharge: 2021-09-22 | Disposition: A | Discharge status: Home / Self Care | Payer: Medicare Other | Attending: Orthopedic Surgery | Admitting: Orthopedic Surgery

## 2021-09-21 ENCOUNTER — Other Ambulatory Visit: Payer: Self-pay

## 2021-09-21 ENCOUNTER — Encounter (HOSPITAL_COMMUNITY)
Admission: RE | Disposition: A | Discharge status: Home / Self Care | Payer: Self-pay | Source: Home / Self Care | Attending: Orthopedic Surgery

## 2021-09-21 DIAGNOSIS — Z85828 Personal history of other malignant neoplasm of skin: Secondary | ICD-10-CM | POA: Diagnosis not present

## 2021-09-21 DIAGNOSIS — E781 Pure hyperglyceridemia: Secondary | ICD-10-CM | POA: Diagnosis not present

## 2021-09-21 DIAGNOSIS — Z8679 Personal history of other diseases of the circulatory system: Secondary | ICD-10-CM | POA: Insufficient documentation

## 2021-09-21 DIAGNOSIS — Z96641 Presence of right artificial hip joint: Secondary | ICD-10-CM | POA: Diagnosis not present

## 2021-09-21 DIAGNOSIS — Z419 Encounter for procedure for purposes other than remedying health state, unspecified: Secondary | ICD-10-CM

## 2021-09-21 DIAGNOSIS — I8393 Asymptomatic varicose veins of bilateral lower extremities: Secondary | ICD-10-CM | POA: Diagnosis not present

## 2021-09-21 DIAGNOSIS — Z9889 Other specified postprocedural states: Secondary | ICD-10-CM | POA: Diagnosis not present

## 2021-09-21 DIAGNOSIS — M1611 Unilateral primary osteoarthritis, right hip: Principal | ICD-10-CM | POA: Insufficient documentation

## 2021-09-21 DIAGNOSIS — Z96649 Presence of unspecified artificial hip joint: Secondary | ICD-10-CM

## 2021-09-21 DIAGNOSIS — Z20822 Contact with and (suspected) exposure to covid-19: Secondary | ICD-10-CM | POA: Insufficient documentation

## 2021-09-21 DIAGNOSIS — Z471 Aftercare following joint replacement surgery: Secondary | ICD-10-CM | POA: Diagnosis not present

## 2021-09-21 DIAGNOSIS — Z01818 Encounter for other preprocedural examination: Secondary | ICD-10-CM

## 2021-09-21 HISTORY — PX: TOTAL HIP ARTHROPLASTY: SHX124

## 2021-09-21 LAB — ABO/RH: ABO/RH(D): A POS

## 2021-09-21 LAB — SARS CORONAVIRUS 2 BY RT PCR (HOSPITAL ORDER, PERFORMED IN ~~LOC~~ HOSPITAL LAB): SARS Coronavirus 2: NEGATIVE

## 2021-09-21 SURGERY — ARTHROPLASTY, HIP, TOTAL, ANTERIOR APPROACH
Anesthesia: Spinal | Site: Hip | Laterality: Right

## 2021-09-21 MED ORDER — FERROUS SULFATE 325 (65 FE) MG PO TABS
325.0000 mg | ORAL_TABLET | Freq: Three times a day (TID) | ORAL | Status: DC
  Administered 2021-09-22: 325 mg via ORAL
  Filled 2021-09-21: qty 1

## 2021-09-21 MED ORDER — DIPHENHYDRAMINE HCL 12.5 MG/5ML PO ELIX: 12.5000 mg | ORAL_SOLUTION | ORAL | Status: DC | PRN

## 2021-09-21 MED ORDER — PROPOFOL 10 MG/ML IV BOLUS
INTRAVENOUS | Status: AC
  Filled 2021-09-21: qty 20

## 2021-09-21 MED ORDER — DEXAMETHASONE SODIUM PHOSPHATE 10 MG/ML IJ SOLN
8.0000 mg | Freq: Once | INTRAMUSCULAR | Status: AC
  Administered 2021-09-21: 10 mg via INTRAVENOUS

## 2021-09-21 MED ORDER — CEFAZOLIN SODIUM-DEXTROSE 2-4 GM/100ML-% IV SOLN
2.0000 g | INTRAVENOUS | Status: AC
  Administered 2021-09-21: 2 g via INTRAVENOUS

## 2021-09-21 MED ORDER — HYDROCODONE-ACETAMINOPHEN 7.5-325 MG PO TABS: 1.0000 | ORAL_TABLET | ORAL | Status: DC | PRN

## 2021-09-21 MED ORDER — HYDROMORPHONE HCL 1 MG/ML IJ SOLN
INTRAMUSCULAR | Status: AC
  Filled 2021-09-21: qty 2

## 2021-09-21 MED ORDER — DEXAMETHASONE SODIUM PHOSPHATE 10 MG/ML IJ SOLN
10.0000 mg | Freq: Once | INTRAMUSCULAR | Status: AC
  Administered 2021-09-22: 10 mg via INTRAVENOUS
  Filled 2021-09-21: qty 1

## 2021-09-21 MED ORDER — HYDROCODONE-ACETAMINOPHEN 5-325 MG PO TABS
1.0000 | ORAL_TABLET | ORAL | Status: DC | PRN
  Administered 2021-09-21 – 2021-09-22 (×2): 1 via ORAL
  Administered 2021-09-22: 2 via ORAL
  Filled 2021-09-21: qty 1
  Filled 2021-09-21: qty 2

## 2021-09-21 MED ORDER — ACETAMINOPHEN 325 MG PO TABS: 325.0000 mg | ORAL_TABLET | Freq: Four times a day (QID) | ORAL | Status: DC | PRN

## 2021-09-21 MED ORDER — CHLORHEXIDINE GLUCONATE 0.12 % MT SOLN
15.0000 mL | Freq: Once | OROMUCOSAL | Status: AC
  Administered 2021-09-21: 15 mL via OROMUCOSAL

## 2021-09-21 MED ORDER — ONDANSETRON HCL 4 MG/2ML IJ SOLN: 4.0000 mg | Freq: Once | INTRAMUSCULAR | Status: DC | PRN

## 2021-09-21 MED ORDER — FENTANYL CITRATE (PF) 100 MCG/2ML IJ SOLN
INTRAMUSCULAR | Status: AC
  Filled 2021-09-21: qty 2

## 2021-09-21 MED ORDER — HYDROMORPHONE HCL 1 MG/ML IJ SOLN
0.2500 mg | INTRAMUSCULAR | Status: DC | PRN
  Administered 2021-09-21 (×2): 0.5 mg via INTRAVENOUS

## 2021-09-21 MED ORDER — DEXAMETHASONE SODIUM PHOSPHATE 10 MG/ML IJ SOLN
INTRAMUSCULAR | Status: AC
  Filled 2021-09-21: qty 1

## 2021-09-21 MED ORDER — BUPIVACAINE IN DEXTROSE 0.75-8.25 % IT SOLN
INTRATHECAL | Status: DC | PRN
  Administered 2021-09-21: 1.8 mL via INTRATHECAL

## 2021-09-21 MED ORDER — PROPOFOL 1000 MG/100ML IV EMUL
INTRAVENOUS | Status: AC
  Filled 2021-09-21: qty 100

## 2021-09-21 MED ORDER — CEFAZOLIN SODIUM-DEXTROSE 2-4 GM/100ML-% IV SOLN
INTRAVENOUS | Status: AC
  Filled 2021-09-21: qty 100

## 2021-09-21 MED ORDER — ACETAMINOPHEN 500 MG PO TABS
ORAL_TABLET | ORAL | Status: AC
  Filled 2021-09-21: qty 2

## 2021-09-21 MED ORDER — ONDANSETRON HCL 4 MG/2ML IJ SOLN
INTRAMUSCULAR | Status: DC | PRN
  Administered 2021-09-21: 4 mg via INTRAVENOUS

## 2021-09-21 MED ORDER — METHOCARBAMOL 500 MG IVPB - SIMPLE MED
500.0000 mg | Freq: Four times a day (QID) | INTRAVENOUS | Status: DC | PRN
  Filled 2021-09-21 (×2): qty 50

## 2021-09-21 MED ORDER — MENTHOL 3 MG MT LOZG: 1.0000 | LOZENGE | OROMUCOSAL | Status: DC | PRN

## 2021-09-21 MED ORDER — POLYETHYLENE GLYCOL 3350 17 G PO PACK: 17.0000 g | PACK | Freq: Every day | ORAL | Status: DC | PRN

## 2021-09-21 MED ORDER — METOCLOPRAMIDE HCL 5 MG PO TABS: 5.0000 mg | ORAL_TABLET | Freq: Three times a day (TID) | ORAL | Status: DC | PRN

## 2021-09-21 MED ORDER — ONDANSETRON HCL 4 MG/2ML IJ SOLN: 4.0000 mg | Freq: Four times a day (QID) | INTRAMUSCULAR | Status: DC | PRN

## 2021-09-21 MED ORDER — MORPHINE SULFATE (PF) 2 MG/ML IV SOLN: 0.5000 mg | INTRAVENOUS | Status: DC | PRN

## 2021-09-21 MED ORDER — FENTANYL CITRATE PF 50 MCG/ML IJ SOSY
25.0000 ug | PREFILLED_SYRINGE | INTRAMUSCULAR | Status: DC | PRN
  Administered 2021-09-21 (×2): 50 ug via INTRAVENOUS

## 2021-09-21 MED ORDER — ASPIRIN 81 MG PO CHEW
81.0000 mg | CHEWABLE_TABLET | Freq: Two times a day (BID) | ORAL | Status: DC
  Administered 2021-09-21 – 2021-09-22 (×2): 81 mg via ORAL
  Filled 2021-09-21: qty 1

## 2021-09-21 MED ORDER — METHOCARBAMOL 500 MG PO TABS
500.0000 mg | ORAL_TABLET | Freq: Four times a day (QID) | ORAL | Status: DC | PRN
  Administered 2021-09-22: 500 mg via ORAL
  Filled 2021-09-21: qty 1

## 2021-09-21 MED ORDER — POVIDONE-IODINE 10 % EX SWAB
2.0000 "application " | Freq: Once | CUTANEOUS | Status: AC
  Administered 2021-09-21: 2 via TOPICAL

## 2021-09-21 MED ORDER — TRANEXAMIC ACID-NACL 1000-0.7 MG/100ML-% IV SOLN
1000.0000 mg | INTRAVENOUS | Status: AC
  Administered 2021-09-21: 1000 mg via INTRAVENOUS

## 2021-09-21 MED ORDER — ACETAMINOPHEN 500 MG PO TABS
1000.0000 mg | ORAL_TABLET | Freq: Once | ORAL | Status: AC
  Administered 2021-09-21: 1000 mg via ORAL

## 2021-09-21 MED ORDER — TRANEXAMIC ACID-NACL 1000-0.7 MG/100ML-% IV SOLN
INTRAVENOUS | Status: AC
  Filled 2021-09-21: qty 100

## 2021-09-21 MED ORDER — FENTANYL CITRATE PF 50 MCG/ML IJ SOSY
PREFILLED_SYRINGE | INTRAMUSCULAR | Status: AC
  Filled 2021-09-21: qty 1

## 2021-09-21 MED ORDER — SODIUM CHLORIDE 0.9 % IV SOLN: INTRAVENOUS | Status: DC

## 2021-09-21 MED ORDER — MORPHINE SULFATE (PF) 2 MG/ML IV SOLN
INTRAVENOUS | Status: AC
  Filled 2021-09-21: qty 1

## 2021-09-21 MED ORDER — FENTANYL CITRATE (PF) 100 MCG/2ML IJ SOLN
INTRAMUSCULAR | Status: DC | PRN
  Administered 2021-09-21 (×3): 50 ug via INTRAVENOUS

## 2021-09-21 MED ORDER — PROPOFOL 500 MG/50ML IV EMUL
INTRAVENOUS | Status: DC | PRN
Start: 2021-09-21 — End: 2021-09-21
  Administered 2021-09-21: 100 ug/kg/min via INTRAVENOUS

## 2021-09-21 MED ORDER — FENTANYL CITRATE PF 50 MCG/ML IJ SOSY
PREFILLED_SYRINGE | INTRAMUSCULAR | Status: AC
  Filled 2021-09-21: qty 2

## 2021-09-21 MED ORDER — MIDAZOLAM HCL 2 MG/2ML IJ SOLN
INTRAMUSCULAR | Status: AC
  Filled 2021-09-21: qty 2

## 2021-09-21 MED ORDER — PROPOFOL 10 MG/ML IV BOLUS
INTRAVENOUS | Status: DC | PRN
  Administered 2021-09-21: 20 mg via INTRAVENOUS

## 2021-09-21 MED ORDER — DOCUSATE SODIUM 100 MG PO CAPS
100.0000 mg | ORAL_CAPSULE | Freq: Two times a day (BID) | ORAL | Status: DC
  Administered 2021-09-21 – 2021-09-22 (×2): 100 mg via ORAL
  Filled 2021-09-21: qty 1

## 2021-09-21 MED ORDER — CELECOXIB 200 MG PO CAPS
200.0000 mg | ORAL_CAPSULE | Freq: Two times a day (BID) | ORAL | Status: DC
  Administered 2021-09-21 – 2021-09-22 (×2): 200 mg via ORAL
  Filled 2021-09-21: qty 1

## 2021-09-21 MED ORDER — ORAL CARE MOUTH RINSE: 15.0000 mL | Freq: Once | OROMUCOSAL | Status: AC

## 2021-09-21 MED ORDER — BISACODYL 10 MG RE SUPP: 10.0000 mg | Freq: Every day | RECTAL | Status: DC | PRN

## 2021-09-21 MED ORDER — MIDAZOLAM HCL 5 MG/5ML IJ SOLN
INTRAMUSCULAR | Status: DC | PRN
  Administered 2021-09-21: 1 mg via INTRAVENOUS

## 2021-09-21 MED ORDER — PHENOL 1.4 % MT LIQD: 1.0000 | OROMUCOSAL | Status: DC | PRN

## 2021-09-21 MED ORDER — 0.9 % SODIUM CHLORIDE (POUR BTL) OPTIME
TOPICAL | Status: DC | PRN
  Administered 2021-09-21: 1000 mL

## 2021-09-21 MED ORDER — LACTATED RINGERS IV SOLN: INTRAVENOUS | Status: DC

## 2021-09-21 MED ORDER — HYDROCODONE-ACETAMINOPHEN 5-325 MG PO TABS
ORAL_TABLET | ORAL | Status: AC
  Filled 2021-09-21: qty 1

## 2021-09-21 MED ORDER — ONDANSETRON HCL 4 MG PO TABS: 4.0000 mg | ORAL_TABLET | Freq: Four times a day (QID) | ORAL | Status: DC | PRN

## 2021-09-21 MED ORDER — METOCLOPRAMIDE HCL 5 MG/ML IJ SOLN: 5.0000 mg | Freq: Three times a day (TID) | INTRAMUSCULAR | Status: DC | PRN

## 2021-09-21 MED ORDER — CEFAZOLIN SODIUM-DEXTROSE 2-4 GM/100ML-% IV SOLN
2.0000 g | Freq: Four times a day (QID) | INTRAVENOUS | Status: AC
  Administered 2021-09-21 – 2021-09-22 (×2): 2 g via INTRAVENOUS
  Filled 2021-09-21: qty 100

## 2021-09-21 MED ORDER — TRANEXAMIC ACID-NACL 1000-0.7 MG/100ML-% IV SOLN
1000.0000 mg | Freq: Once | INTRAVENOUS | Status: AC
  Administered 2021-09-21: 1000 mg via INTRAVENOUS

## 2021-09-21 MED ORDER — ONDANSETRON HCL 4 MG/2ML IJ SOLN
INTRAMUSCULAR | Status: AC
  Filled 2021-09-21: qty 2

## 2021-09-21 SURGICAL SUPPLY — 41 items
BAG COUNTER SPONGE SURGICOUNT (BAG) IMPLANT
BAG DECANTER FOR FLEXI CONT (MISCELLANEOUS) IMPLANT
BAG SURGICOUNT SPONGE COUNTING (BAG)
BAG ZIPLOCK 12X15 (MISCELLANEOUS) IMPLANT
BLADE SAG 18X100X1.27 (BLADE) ×3 IMPLANT
COVER PERINEAL POST (MISCELLANEOUS) ×3 IMPLANT
COVER SURGICAL LIGHT HANDLE (MISCELLANEOUS) ×3 IMPLANT
CUP ACET PINNACLE SECTR 58MM (Hips) ×1 IMPLANT
DERMABOND ADVANCED (GAUZE/BANDAGES/DRESSINGS) ×2
DERMABOND ADVANCED .7 DNX12 (GAUZE/BANDAGES/DRESSINGS) ×1 IMPLANT
DRAPE FOOT SWITCH (DRAPES) ×3 IMPLANT
DRAPE STERI IOBAN 125X83 (DRAPES) ×3 IMPLANT
DRAPE U-SHAPE 47X51 STRL (DRAPES) ×6 IMPLANT
DRESSING AQUACEL AG SP 3.5X10 (GAUZE/BANDAGES/DRESSINGS) ×1 IMPLANT
DRSG AQUACEL AG SP 3.5X10 (GAUZE/BANDAGES/DRESSINGS) ×3
DURAPREP 26ML APPLICATOR (WOUND CARE) ×3 IMPLANT
ELECT REM PT RETURN 15FT ADLT (MISCELLANEOUS) ×3 IMPLANT
ELIMINATOR HOLE APEX DEPUY (Hips) ×3 IMPLANT
GLOVE SURG ENC MOIS LTX SZ7 (GLOVE) ×3 IMPLANT
GLOVE SURG UNDER LTX SZ6.5 (GLOVE) ×3 IMPLANT
GLOVE SURG UNDER POLY LF SZ7.5 (GLOVE) ×3 IMPLANT
GOWN STRL REUS W/TWL LRG LVL3 (GOWN DISPOSABLE) ×6 IMPLANT
HEAD M SROM 36MM PLUS 1.5 (Hips) ×1 IMPLANT
HOLDER FOLEY CATH W/STRAP (MISCELLANEOUS) ×3 IMPLANT
KIT TURNOVER KIT A (KITS) IMPLANT
LINER NEUTRAL 36X58 PLUS4 ×3 IMPLANT
PACK ANTERIOR HIP CUSTOM (KITS) ×3 IMPLANT
PENCIL SMOKE EVACUATOR (MISCELLANEOUS) IMPLANT
PINNACLE SECTOR CUP 58MM (Hips) ×3 IMPLANT
SCREW 6.5MMX30MM (Screw) ×3 IMPLANT
SROM M HEAD 36MM PLUS 1.5 (Hips) ×3 IMPLANT
STEM FEMORAL SZ6 HIGH ACTIS (Stem) ×3 IMPLANT
SUT MNCRL AB 4-0 PS2 18 (SUTURE) ×3 IMPLANT
SUT STRATAFIX 0 PDS 27 VIOLET (SUTURE) ×3
SUT VIC AB 1 CT1 36 (SUTURE) ×9 IMPLANT
SUT VIC AB 2-0 CT1 27 (SUTURE) ×4
SUT VIC AB 2-0 CT1 TAPERPNT 27 (SUTURE) ×2 IMPLANT
SUTURE STRATFX 0 PDS 27 VIOLET (SUTURE) ×1 IMPLANT
TRAY FOLEY MTR SLVR 16FR STAT (SET/KITS/TRAYS/PACK) IMPLANT
TUBE SUCTION HIGH CAP CLEAR NV (SUCTIONS) ×3 IMPLANT
WATER STERILE IRR 1000ML POUR (IV SOLUTION) ×3 IMPLANT

## 2021-09-21 NOTE — Interval H&P Note (Signed)
History and Physical Interval Note:  09/21/2021 2:21 PM  Chase Knapp  has presented today for surgery, with the diagnosis of Right hip osteoarthritis.  The various methods of treatment have been discussed with the patient and family. After consideration of risks, benefits and other options for treatment, the patient has consented to  Procedure(s): TOTAL HIP ARTHROPLASTY ANTERIOR APPROACH (Right) as a surgical intervention.  The patient's history has been reviewed, patient examined, no change in status, stable for surgery.  I have reviewed the patient's chart and labs.  Questions were answered to the patient's satisfaction.     [name]

## 2021-09-21 NOTE — Care Plan (Signed)
Ortho Bundle Case Management Note  Patient Details  Name: Chase Knapp MRN: [id number] Date of Birth: 1946-03-16  R THA on 09-21-21 DCP:  Home with wife. DME:  No needs, has a RW PT:  HEP                   DME Arranged:  N/A DME Agency:  NA  HH Arranged:  NA HH Agency:  NA  Additional Comments: Please contact me with any questions of if this plan should need to change.  Marianne Sofia, RN,CCM [facility]  [phone] 09/21/2021, 2:34 PM

## 2021-09-21 NOTE — Anesthesia Procedure Notes (Addendum)
Spinal  Patient location during procedure: OR End time: 09/21/2021 3:43 PM Reason for block: surgical anesthesia Staffing Performed: resident/CRNA  Resident/CRNA: Lissa Morales, CRNA Preanesthetic Checklist Completed: patient identified, IV checked, site marked, risks and benefits discussed, surgical consent, monitors and equipment checked, pre-op evaluation and timeout performed Spinal Block Patient position: sitting Prep: DuraPrep Patient monitoring: heart rate, continuous pulse ox and blood pressure Approach: midline Location: L3-4 Injection technique: single-shot Needle Needle type: Pencan  Needle gauge: 24 G Needle length: 9 cm Assessment Sensory level: T4 Events: CSF return Additional Notes IV functioning, monitors applied to pt. Expiration date of kit checked and confirmed to be in date. Sterile prep and drape, hand hygiene and sterile gloved used. Pt was positioned and spine was prepped in sterile fashion. Skin was anesthetized with lidocaine. Free flow of clear CSF obtained prior to injecting local anesthetic into CSF x 1 attempt. Spinal needle aspirated freely following injection. Needle was carefully withdrawn, and pt tolerated procedure well.

## 2021-09-21 NOTE — Anesthesia Procedure Notes (Signed)
Procedure Name: MAC Date/Time: 09/21/2021 3:34 PM Performed by: Lissa Morales, CRNA Pre-anesthesia Checklist: Patient identified, Emergency Drugs available, Suction available and Patient being monitored Patient Re-evaluated:Patient Re-evaluated prior to induction Oxygen Delivery Method: Simple face mask Placement Confirmation: positive ETCO2

## 2021-09-21 NOTE — Op Note (Signed)
NAME:  Chase Knapp                ACCOUNT NO.: [id number]      MEDICAL RECORD NO.: [id number]      FACILITY:  [facility]      PHYSICIAN:  [name]  DATE OF BIRTH:  03/22/1946     DATE OF PROCEDURE:  09/21/2021                                 OPERATIVE REPORT         PREOPERATIVE DIAGNOSIS: Right  hip osteoarthritis.      POSTOPERATIVE DIAGNOSIS:  Right hip osteoarthritis.      PROCEDURE:  Right total hip replacement through an anterior approach   utilizing DePuy THR system, component size 58 mm pinnacle cup, a size 36+4 neutral   Altrex liner, a size 6 Hi Actis stem with a 36+1.5 Articuleze metal head bal      SURGEON:  Pietro Cassis. Alvan Dame, M.D.      ASSISTANT:  Costella Hatcher, PA-C     ANESTHESIA:  Spinal.      SPECIMENS:  None.      COMPLICATIONS:  None.      BLOOD LOSS:  800 cc     DRAINS:  None.      INDICATION OF THE PROCEDURE:  Chase Knapp is a 75 y.o. male who had   presented to office for evaluation of right hip pain.  Radiographs revealed   progressive degenerative changes with bone-on-bone   articulation of the  hip joint, including subchondral cystic changes and osteophytes.  The patient had painful limited range of   motion significantly affecting their overall quality of life and function.  The patient was failing to    respond to conservative measures including medications and/or injections and activity modification and at this point was ready   to proceed with more definitive measures.  Consent was obtained for   benefit of pain relief.  Specific risks of infection, DVT, component   failure, dislocation, neurovascular injury, and need for revision surgery were reviewed in the office as well discussion of   the anterior versus posterior approach were reviewed.     PROCEDURE IN DETAIL:  The patient was brought to operative theater.   Once adequate anesthesia, preoperative antibiotics, 2 gm of Ancef, 1 gm of Tranexamic Acid, and 10  mg of Decadron were administered, the patient was positioned supine on the [employer] table.  Once the patient was safely positioned with adequate padding of boney prominences we predraped out the hip, and used fluoroscopy to confirm orientation of the pelvis.      The right hip was then prepped and draped from proximal iliac crest to   mid thigh with a shower curtain technique.      Time-out was performed identifying the patient, planned procedure, and the appropriate extremity.     An incision was then made 2 cm lateral to the   anterior superior iliac spine extending over the orientation of the   tensor fascia lata muscle and sharp dissection was carried down to the   fascia of the muscle.      The fascia was then incised.  The muscle belly was identified and swept   laterally and retractor placed along the superior neck.  Following   cauterization of the circumflex vessels and removing some pericapsular  fat, a second cobra retractor was placed on the inferior neck.  A T-capsulotomy was made along the line of the   superior neck to the trochanteric fossa, then extended proximally and   distally.  Tag sutures were placed and the retractors were then placed   intracapsular.  We then identified the trochanteric fossa and   orientation of my neck cut and then made a neck osteotomy with the femur on traction.  The femoral   head was removed without difficulty or complication.  Traction was let   off and retractors were placed posterior and anterior around the   acetabulum.      The labrum and foveal tissue were debrided.  I began reaming with a 47 mm   reamer and reamed up to 57 mm reamer with good bony bed preparation and a 58 mm  cup was chosen.  The final 58 mm Pinnacle cup was then impacted under fluoroscopy to confirm the depth of penetration and orientation with respect to   Abduction and forward flexion.  A screw was placed into the ilium followed by the hole eliminator.  The final    36+4 neutral Altrex liner was impacted with good visualized rim fit.  The cup was positioned anatomically within the acetabular portion of the pelvis.      At this point, the femur was rolled to 100 degrees.  Further capsule was   released off the inferior aspect of the femoral neck.  I then   released the superior capsule proximally.  With the leg in a neutral position the hook was placed laterally   along the femur under the vastus lateralis origin and elevated manually and then held in position using the hook attachment on the bed.  The leg was then extended and adducted with the leg rolled to 100   degrees of external rotation.  Retractors were placed along the medial calcar and posteriorly over the greater trochanter.  Once the proximal femur was fully   exposed, I used a box osteotome to set orientation.  I then began   broaching with the starting chili pepper broach and passed this by hand and then broached up to 6.  With the 6 broach in place I chose a high offset neck and did several trial reductions.  The offset was appropriate, leg lengths   appeared to be equal best matched with the +1.5 head ball trial confirmed radiographically.   Given these findings, I went ahead and dislocated the hip, repositioned all   retractors and positioned the right hip in the extended and abducted position.  The final 6 Hi Actis stem was   chosen and it was impacted down to the level of neck cut.  Based on this   and the trial reductions, a final 36+1.5 Articuleze metal head ball was chosen and   impacted onto a clean and dry trunnion, and the hip was reduced.  The   hip had been irrigated throughout the case again at this point.  I did   reapproximate the superior capsular leaflet to the anterior leaflet   using #1 Vicryl.  The fascia of the   tensor fascia lata muscle was then reapproximated using #1 Vicryl and #0 Stratafix sutures.  The   remaining wound was closed with 2-0 Vicryl and running 4-0  Monocryl.   The hip was cleaned, dried, and dressed sterilely using Dermabond and   Aquacel dressing.  The patient was then brought   to recovery room  in stable condition tolerating the procedure well.    Costella Hatcher, PA-C was present for the entirety of the case involved from   preoperative positioning, perioperative retractor management, general   facilitation of the case, as well as primary wound closure as assistant.            Pietro Cassis Alvan Dame, M.D.        09/21/2021 2:30 PM

## 2021-09-21 NOTE — Anesthesia Postprocedure Evaluation (Signed)
Anesthesia Post Note  Patient: Iasiah Ozment  Procedure(s) Performed: TOTAL HIP ARTHROPLASTY ANTERIOR APPROACH (Right: Hip)     Patient location during evaluation: PACU Anesthesia Type: Spinal Level of consciousness: oriented, awake and alert and awake Pain management: pain level controlled Vital Signs Assessment: post-procedure vital signs reviewed and stable Respiratory status: spontaneous breathing, respiratory function stable and nonlabored ventilation Cardiovascular status: blood pressure returned to baseline and stable Postop Assessment: no headache, no backache, no apparent nausea or vomiting, spinal receding and patient able to bend at knees Anesthetic complications: no   No notable events documented.  Last Vitals:  Vitals:   09/21/21 1830 09/21/21 1843  BP: (!) 146/87 (!) 154/89  Pulse: (!) 57 (!) 54  Resp: 14 18  Temp: (!) 36.3 C 36.4 C  SpO2: 100% 100%    Last Pain:  Vitals:   09/21/21 1843  TempSrc: Oral  PainSc:                  Catalina Gravel

## 2021-09-21 NOTE — Discharge Instructions (Signed)

## 2021-09-21 NOTE — Transfer of Care (Signed)
Immediate Anesthesia Transfer of Care Note  Patient: Chase Knapp  Procedure(s) Performed: TOTAL HIP ARTHROPLASTY ANTERIOR APPROACH (Right: Hip)  Patient Location: PACU  Anesthesia Type:Spinal  Level of Consciousness: awake, drowsy and patient cooperative  Airway & Oxygen Therapy: Patient Spontanous Breathing and Patient connected to face mask oxygen  Post-op Assessment: Report given to RN, Post -op Vital signs reviewed and stable and Patient moving all extremities X 4  Post vital signs: stable  Last Vitals:  Vitals Value Taken Time  BP 126/81 09/21/21 1745  Temp 36.5 C 09/21/21 1733  Pulse 60 09/21/21 1746  Resp 12 09/21/21 1746  SpO2 93 % 09/21/21 1746  Vitals shown include unvalidated device data.  Last Pain:  Vitals:   09/21/21 1733  PainSc: 7       Patients Stated Pain Goal: 1 (91/69/45 0388)  Complications: No notable events documented.

## 2021-09-22 ENCOUNTER — Encounter (HOSPITAL_COMMUNITY): Payer: Self-pay | Admitting: Orthopedic Surgery

## 2021-09-22 DIAGNOSIS — Z85828 Personal history of other malignant neoplasm of skin: Secondary | ICD-10-CM | POA: Diagnosis not present

## 2021-09-22 DIAGNOSIS — M1611 Unilateral primary osteoarthritis, right hip: Secondary | ICD-10-CM | POA: Diagnosis not present

## 2021-09-22 DIAGNOSIS — Z20822 Contact with and (suspected) exposure to covid-19: Secondary | ICD-10-CM | POA: Diagnosis not present

## 2021-09-22 DIAGNOSIS — Z8679 Personal history of other diseases of the circulatory system: Secondary | ICD-10-CM | POA: Diagnosis not present

## 2021-09-22 LAB — CBC
HCT: 34.3 % — ABNORMAL LOW (ref 39.0–52.0)
Hemoglobin: 11.7 g/dL — ABNORMAL LOW (ref 13.0–17.0)
MCH: 31.5 pg (ref 26.0–34.0)
MCHC: 34.1 g/dL (ref 30.0–36.0)
MCV: 92.5 fL (ref 80.0–100.0)
Platelets: 154 10*3/uL (ref 150–400)
RBC: 3.71 MIL/uL — ABNORMAL LOW (ref 4.22–5.81)
RDW: 12.1 % (ref 11.5–15.5)
WBC: 10.4 10*3/uL (ref 4.0–10.5)
nRBC: 0 % (ref 0.0–0.2)

## 2021-09-22 LAB — BASIC METABOLIC PANEL
Anion gap: 6 (ref 5–15)
BUN: 18 mg/dL (ref 8–23)
CO2: 25 mmol/L (ref 22–32)
Calcium: 8.1 mg/dL — ABNORMAL LOW (ref 8.9–10.3)
Chloride: 102 mmol/L (ref 98–111)
Creatinine, Ser: 0.85 mg/dL (ref 0.61–1.24)
GFR, Estimated: >60 mL/min (ref >60)
Glucose, Bld: 190 mg/dL — ABNORMAL HIGH (ref 70–99)
Potassium: 4.5 mmol/L (ref 3.5–5.1)
Sodium: 133 mmol/L — ABNORMAL LOW (ref 135–145)

## 2021-09-22 MED ORDER — ASPIRIN 81 MG PO CHEW: 81.0000 mg | CHEWABLE_TABLET | Freq: Two times a day (BID) | ORAL | 0 refills | Status: AC

## 2021-09-22 MED ORDER — POLYETHYLENE GLYCOL 3350 17 G PO PACK: 17.0000 g | PACK | Freq: Every day | ORAL | 0 refills | Status: DC | PRN

## 2021-09-22 MED ORDER — METHOCARBAMOL 500 MG PO TABS: 500.0000 mg | ORAL_TABLET | Freq: Four times a day (QID) | ORAL | 0 refills | Status: DC | PRN

## 2021-09-22 MED ORDER — DOCUSATE SODIUM 100 MG PO CAPS: 100.0000 mg | ORAL_CAPSULE | Freq: Two times a day (BID) | ORAL | 0 refills | Status: DC

## 2021-09-22 MED ORDER — CELECOXIB 200 MG PO CAPS: 200.0000 mg | ORAL_CAPSULE | Freq: Two times a day (BID) | ORAL | 0 refills | Status: DC

## 2021-09-22 MED ORDER — HYDROCODONE-ACETAMINOPHEN 5-325 MG PO TABS: 1.0000 | ORAL_TABLET | Freq: Four times a day (QID) | ORAL | 0 refills | Status: DC | PRN

## 2021-09-22 NOTE — Evaluation (Signed)
Physical Therapy Evaluation Patient Details Name: Chase Knapp MRN: [id number] DOB: [birth year] Today's Date: 09/22/2021  History of Present Illness  Pt is 75 yo male s/p R anterior THA on 09/21/21.  Pt with hx including skin CA, OA, R knee MRSA infection.  Clinical Impression  Pt is s/p THA resulting in the deficits listed below (see PT Problem List). At baseline, pt very independent and active.  Today, pt able to ambulate 100' and perform stairs similar to home set up.  He demonstrates safe transfers and gait.  From PT perspective safe to d/c home with support.  If remains hospitalized, Pt will benefit from skilled PT to increase their independence and safety with mobility to allow discharge to the venue listed below.         Recommendations for follow up therapy are one component of a multi-disciplinary discharge planning process, led by the attending physician.  Recommendations may be updated based on patient status, additional functional criteria and insurance authorization.  Follow Up Recommendations Follow physician's recommendations for discharge plan and follow up therapies    Assistance Recommended at Discharge Intermittent Supervision/Assistance  Functional Status Assessment Patient has had a recent decline in their functional status and demonstrates the ability to make significant improvements in function in a reasonable and predictable amount of time.  Equipment Recommendations  Rolling walker (2 wheels)    Recommendations for Other Services       Precautions / Restrictions Precautions Precautions: Fall Restrictions Weight Bearing Restrictions: Yes RLE Weight Bearing: Weight bearing as tolerated      Mobility  Bed Mobility Overal bed mobility: Needs Assistance Bed Mobility: Supine to Sit;Sit to Supine     Supine to sit: Supervision;HOB elevated Sit to supine: Min assist;HOB elevated   General bed mobility comments: Cues for positioning and sequencing; utilized  gait belt to assist R leg; did require very light min A R leg back to bed    Transfers Overall transfer level: Needs assistance Equipment used: Rolling walker (2 wheels) Transfers: Sit to/from Stand Sit to Stand: Min guard;Supervision           General transfer comment: Performed x 4 during session; min guard progressing to supervision ; initial cues for hand placement and R LE management    Ambulation/Gait Ambulation/Gait assistance: Supervision Gait Distance (Feet): 100 Feet Assistive device: Rolling walker (2 wheels) Gait Pattern/deviations: Step-to pattern;Decreased stride length;Decreased weight shift to right Gait velocity: decreased     General Gait Details: Started step to R gait progressing to step through; cued on RW use and sequencing initially; able to turn and take steps forward and backward safely  Stairs Stairs: Yes Stairs assistance: Min guard Stair Management: One rail Left;With cane;Step to pattern;Forwards Number of Stairs: 5 General stair comments: cued for sequencing and cane use; used rail on L (up) and cane on R with min guard  Wheelchair Mobility    Modified Rankin (Stroke Patients Only)       Balance Overall balance assessment: Needs assistance Sitting-balance support: No upper extremity supported Sitting balance-Leahy Scale: Normal     Standing balance support: No upper extremity supported;Bilateral upper extremity supported Standing balance-Leahy Scale: Good Standing balance comment: RW to ambulate; static standing and ADLs without UE support                             Pertinent Vitals/Pain Pain Assessment: 0-10 Pain Score: 2  Pain Location: R hip Pain Descriptors / Indicators:  Discomfort Pain Intervention(s): Limited activity within patient's tolerance;Monitored during session    [place] expects to be discharged to:: Private residence Living Arrangements: Spouse/significant other Available Help at  Discharge: Family;Available 24 hours/day Type of Home: House Home Access: Stairs to enter Entrance Stairs-Rails: Psychiatric nurse of Steps: 3   Home Layout: Two level;Able to live on main level with bedroom/bathroom Home Equipment: Grab bars - tub/shower;Shower Land (2 wheels);Cane - single point Additional Comments: borrowed BSC    Prior Function Prior Level of Function : Independent/Modified Independent             Mobility Comments: Very independent and active; rides road bikes several times week for 25-30 mins ADLs Comments: Independent     Hand Dominance        Extremity/Trunk Assessment   Upper Extremity Assessment Upper Extremity Assessment: Overall WFL for tasks assessed    Lower Extremity Assessment Lower Extremity Assessment: RLE deficits/detail RLE Deficits / Details: Expected post op changes; ROM: WFL; MMT: ankle 5/5, knee 3/5, hip 2/5    Cervical / Trunk Assessment Cervical / Trunk Assessment: Normal  Communication   Communication: No difficulties  Cognition Arousal/Alertness: Awake/alert Behavior During Therapy: WFL for tasks assessed/performed Overall Cognitive Status: Within Functional Limits for tasks assessed                                          General Comments  Educated on safe ice use, no pivots, car transfers, TED hose during day. Also, encouraged walking every 1-2 hours during day. Educated on HEP with focus on mobility the first weeks. Discussed doing exercises within pain control and if pain increasing could decreased ROM, reps, and stop exercises as needed. Encouraged to perform quad sets and ankle pumps frequently for blood flow and to promote full knee extension.  Pt with frequent questions about dislocations/precautions - discussed anterior approach no precautions. Pt had watched lots of videos to prepare for recovery - well educated on transfer techniques and recovery process.       Exercises Total Joint Exercises Ankle Circles/Pumps: AROM;Both;10 reps;Supine Quad Sets: AROM;Both;10 reps;Supine Heel Slides: AAROM;Right;5 reps;Supine Hip ABduction/ADduction: AAROM;Right;5 reps;Supine Long Arc Quad: AROM;Right;10 reps;Supine Other Exercises Other Exercises: educated on use of gait belt for AAROM as able   Assessment/Plan    PT Assessment Patient needs continued PT services  PT Problem List Decreased strength;Decreased mobility;Decreased range of motion;Decreased activity tolerance;Decreased balance;Decreased knowledge of use of DME;Pain       PT Treatment Interventions DME instruction;Therapeutic activities;Modalities;Gait training;Therapeutic exercise;Patient/family education;Stair training;Balance training;Functional mobility training    PT Goals (Current goals can be found in the Care Plan section)  Acute Rehab PT Goals Patient Stated Goal: return home PT Goal Formulation: With patient/family Time For Goal Achievement: 10/06/21 Potential to Achieve Goals: Good    Frequency 7X/week   Barriers to discharge        Co-evaluation               AM-PAC PT "6 Clicks" Mobility  Outcome Measure Help needed turning from your back to your side while in a flat bed without using bedrails?: A Little Help needed moving from lying on your back to sitting on the side of a flat bed without using bedrails?: A Little Help needed moving to and from a bed to a chair (including a wheelchair)?: A Little Help needed standing up from  a chair using your arms (e.g., wheelchair or bedside chair)?: A Little Help needed to walk in hospital room?: A Little Help needed climbing 3-5 steps with a railing? : A Little 6 Click Score: 18    End of Session Equipment Utilized During Treatment: Gait belt Activity Tolerance: Patient tolerated treatment well Patient left: in bed;with call bell/phone within reach;with family/visitor present Nurse Communication: Mobility status PT  Visit Diagnosis: Other abnormalities of gait and mobility (R26.89);Muscle weakness (generalized) (M62.81)    Time: 9971-8209 PT Time Calculation (min) (ACUTE ONLY): 50 min   Charges:   PT Evaluation $PT Eval Low Complexity: 1 Low PT Treatments $Gait Training: 8-22 mins $Therapeutic Exercise: 8-22 mins        Abran Richard, PT Acute Rehab Services Pager [phone] [facility] [place] 09/22/2021, 11:45 AM

## 2021-09-22 NOTE — Progress Notes (Signed)
   Subjective: 1 Day Post-Op Procedure(s) (LRB): TOTAL HIP ARTHROPLASTY ANTERIOR APPROACH (Right) Patient reports pain as mild.   Patient seen in rounds by Dr. Alvan Dame. Patient is resting in bed on exam with his wife at the bedside. He has not been up with PT. Foley catheter removed.  We will start therapy today.   Objective: Vital signs in last 24 hours: Temp:  [97.4 F (36.3 C)-97.9 F (36.6 C)] 97.5 F (36.4 C) (11/30 0539) Pulse Rate:  [46-80] 67 (11/30 0539) Resp:  [12-18] 16 (11/30 0539) BP: (110-163)/(72-95) 110/72 (11/30 0539) SpO2:  [92 %-100 %] 96 % (11/30 0539) Weight:  [90.7 kg] 90.7 kg (11/29 1338)  Intake/Output from previous day:  Intake/Output Summary (Last 24 hours) at 09/22/2021 0730 Last data filed at 09/22/2021 0200 Gross per 24 hour  Intake 4255.79 ml  Output 1250 ml  Net 3005.79 ml     Intake/Output this shift: No intake/output data recorded.  Labs: Recent Labs    09/22/21 0328  HGB 11.7*   Recent Labs    09/22/21 0328  WBC 10.4  RBC 3.71*  HCT 34.3*  PLT 154   Recent Labs    09/22/21 0328  NA 133*  K 4.5  CL 102  CO2 25  BUN 18  CREATININE 0.85  GLUCOSE 190*  CALCIUM 8.1*   No results for input(s): LABPT, INR in the last 72 hours.  Exam: General - Patient is Alert and Oriented Extremity - Neurologically intact Sensation intact distally Intact pulses distally Dorsiflexion/Plantar flexion intact Dressing - dressing C/D/I Motor Function - intact, moving foot and toes well on exam.   Past Medical History:  Diagnosis Date   Cancer ([place]) 2006   skin on Lt upper arm   Dysrhythmia 2020   A-Fib   Hypertriglyceridemia    MRSA infection    Right knee prolonged IV antibiotics   Osteoarthritis    Varicose veins of both lower extremities     Assessment/Plan: 1 Day Post-Op Procedure(s) (LRB): TOTAL HIP ARTHROPLASTY ANTERIOR APPROACH (Right) Principal Problem:   S/P total hip arthroplasty Active Problems:   S/P total right  hip arthroplasty  Estimated body mass index is 30.86 kg/m as calculated from the following:   Height as of this encounter: 5' 7.5" (1.715 m).   Weight as of this encounter: 90.7 kg. Advance diet Up with therapy D/C IV fluids  DVT Prophylaxis - Aspirin Weight bearing as tolerated.  Plan is to go Home after hospital stay. Plan to get up with PT today. Discharge home after 1-2 sessions as long as he is meeting his goals. Follow up in the office in 2 weeks.   Griffith Citron, PA-C Orthopedic Surgery [phone] 09/22/2021, 7:30 AM

## 2021-09-22 NOTE — Plan of Care (Signed)
  Problem: Pain Managment: Goal: General experience of comfort will improve Outcome: Progressing   Problem: Coping: Goal: Level of anxiety will decrease Outcome: Progressing   Problem: Safety: Goal: Ability to remain free from injury will improve Outcome: Progressing   

## 2021-09-22 NOTE — TOC Transition Note (Signed)
Transition of Care [facility]) - CM/SW Discharge Note   Patient Details  Name: Chase Knapp MRN: [id number] Date of Birth: 02-03-1946  Transition [facility]) CM/SW Contact:  Lennart Pall, LCSW Phone Number: 09/22/2021, 11:22 AM   Clinical Narrative:    Met with pt and confirming he has all needed DME at home.  Plan for HEP.  No TOC needs.   Final next level of care: Home/Self Care Barriers to Discharge: No Barriers Identified   Patient Goals and CMS Choice Patient states their goals for this hospitalization and ongoing recovery are:: return home      Discharge Placement                       Discharge Plan and Services                DME Arranged: N/A DME Agency: NA       HH Arranged: NA HH Agency: NA        Social Determinants of Health (SDOH) Interventions     Readmission Risk Interventions No flowsheet data found.

## 2021-10-05 NOTE — Discharge Summary (Signed)
Physician Discharge Summary   Patient ID: Chase Knapp MRN: [id number] DOB/AGE: 04/15/1946 75 y.o.  Admit date: 09/21/2021 Discharge date: 09/22/2021  Primary Diagnosis: Right  hip osteoarthritis.   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   Weight as of this encounter: 90.7 kg.  Procedure:  Procedure(s) (LRB): TOTAL HIP ARTHROPLASTY ANTERIOR APPROACH (Right)   Consults: None  HPI: Chase Knapp is a 75 y.o. male who had   presented to office for evaluation of right hip pain.  Radiographs revealed   progressive degenerative changes with bone-on-bone   articulation of the  hip joint, including subchondral cystic changes and osteophytes.  The patient had painful limited range of   motion significantly affecting their overall quality of life and function.  The patient was failing to    respond to conservative measures including medications and/or injections and activity modification and at this point was ready   to proceed with more definitive measures.  Consent was obtained for   benefit of pain relief.  Specific risks of infection, DVT, component   failure, dislocation, neurovascular injury, and need for revision surgery were reviewed in the office as well discussion of   the anterior versus posterior approach were reviewed.  Laboratory Data: Admission on 09/21/2021, Discharged on 09/22/2021  Component Date Value Ref Range Status   ABO/RH(D) 09/21/2021    Final                   Value:A  POS Performed at [facility], [place] [address], [place], [place] [address]    SARS Coronavirus 2 09/21/2021 NEGATIVE  NEGATIVE Final   Comment: (NOTE) SARS-CoV-2 target nucleic acids are NOT DETECTED.  The SARS-CoV-2 RNA is generally detectable in upper and lower respiratory specimens during the acute phase of infection. The lowest concentration of SARS-CoV-2 viral copies this assay can detect is 250 copies / mL. A negative result does not preclude SARS-CoV-2 infection and should not be used as the sole basis for treatment or other patient management decisions.  A negative result may occur with improper specimen collection / handling, submission of specimen other than nasopharyngeal swab, presence of viral mutation(s) within the areas targeted by this assay, and inadequate number of viral copies (<250 copies / mL). A negative result must be combined with clinical observations, patient history, and epidemiological information.  Fact Sheet for Patients:   StrictlyIdeas.no  Fact Sheet for Healthcare Providers: BankingDealers.co.za  This test is not yet approved or                           cleared by the [place] FDA and has been authorized for detection and/or diagnosis of SARS-CoV-2 by FDA under an Emergency Use Authorization (EUA).  This EUA will remain in effect (meaning this test can be used) for the duration of the COVID-19 declaration under Section 564(b)(1) of the Act, 21 U.S.C.  section 360bbb-3(b)(1), unless the authorization is terminated or revoked sooner.  Performed at [facility], [place] [address] [place], [place] [address]    WBC 09/22/2021 10.4  4.0 - 10.5 K/uL Final   RBC 09/22/2021 3.71 (L)  4.22 - 5.81 MIL/uL Final   Hemoglobin 09/22/2021 11.7 (L)  13.0 - 17.0 g/dL Final   HCT 09/22/2021 34.3 (L)  39.0 - 52.0 % Final   MCV 09/22/2021 92.5  80.0 - 100.0 fL Final   MCH  09/22/2021 31.5  26.0 - 34.0 pg Final   MCHC 09/22/2021 34.1  30.0 - 36.0 g/dL Final   RDW 09/22/2021 12.1  11.5 - 15.5 % Final   Platelets 09/22/2021 154  150 - 400 K/uL Final   nRBC 09/22/2021 0.0  0.0 - 0.2 % Final   Performed at [facility], [place] [address] [place], [place] [address]   Sodium 09/22/2021 133 (L)  135 - 145 mmol/L Final   Potassium 09/22/2021 4.5  3.5 - 5.1 mmol/L Final   Chloride 09/22/2021 102  98 - 111 mmol/L Final   CO2 09/22/2021 25  22 - 32 mmol/L Final   Glucose, Bld 09/22/2021 190 (H)  70 - 99 mg/dL Final   Glucose reference range applies only to samples taken after fasting for at least 8 hours.   BUN 09/22/2021 18  8 - 23 mg/dL Final   Creatinine, Ser 09/22/2021 0.85  0.61 - 1.24 mg/dL Final   Calcium 09/22/2021 8.1 (L)  8.9 - 10.3 mg/dL Final   GFR, Estimated 09/22/2021 >60  >60 mL/min Final   Comment: (NOTE) Calculated using the CKD-EPI Creatinine Equation (2021)    Anion gap 09/22/2021 6  5 - 15 Final   Performed at [facility], [place] [address] [place], [place] [address]  Hospital Outpatient Visit on 09/10/2021  Component Date Value Ref Range Status   WBC 09/10/2021 5.7  4.0 - 10.5 K/uL Final   RBC 09/10/2021 4.34  4.22 - 5.81 MIL/uL Final   Hemoglobin 09/10/2021 13.8  13.0 - 17.0 g/dL Final   HCT 09/10/2021 40.7  39.0 - 52.0 % Final   MCV 09/10/2021 93.8  80.0 - 100.0 fL Final   MCH 09/10/2021 31.8  26.0 - 34.0 pg Final   MCHC 09/10/2021 33.9  30.0 - 36.0 g/dL Final   RDW 09/10/2021 12.4  11.5 - 15.5 % Final   Platelets 09/10/2021 193  150 - 400 K/uL Final   nRBC 09/10/2021 0.0  0.0 - 0.2 % Final   Performed at [facility], [place] [address] [place], [place] [address]   Sodium 09/10/2021 137  135 - 145 mmol/L Final   Potassium 09/10/2021 4.8  3.5 - 5.1 mmol/L Final   Chloride 09/10/2021 106  98 - 111 mmol/L Final   CO2 09/10/2021 27  22 - 32 mmol/L Final   Glucose, Bld 09/10/2021 96  70 -  99 mg/dL Final   Glucose reference range applies only to samples taken after fasting for at least 8 hours.   BUN 09/10/2021 23  8 - 23 mg/dL Final   Creatinine, Ser 09/10/2021 0.82  0.61 - 1.24 mg/dL Final   Calcium 09/10/2021 9.2  8.9 - 10.3 mg/dL Final   Total Protein 09/10/2021 6.9  6.5 - 8.1 g/dL Final   Albumin 09/10/2021 4.2  3.5 - 5.0 g/dL Final   AST 09/10/2021 23  15 - 41 U/L Final   ALT 09/10/2021 21  0 -  44 U/L Final   Alkaline Phosphatase 09/10/2021 61  38 - 126 U/L Final   Total Bilirubin 09/10/2021 1.1  0.3 - 1.2 mg/dL Final   GFR, Estimated 09/10/2021 >60  >60 mL/min Final   Comment: (NOTE) Calculated using the CKD-EPI Creatinine Equation (2021)    Anion gap 09/10/2021 4 (L)  5 - 15 Final   Performed at [facility], [place] [address], [place], [place] [address]   ABO/RH(D) 09/10/2021 A POS   Final   Antibody Screen 09/10/2021 NEG   Final   Sample Expiration 09/10/2021 09/24/2021,2359   Final   Extend sample reason 09/10/2021    Final                   Value:NO TRANSFUSIONS OR PREGNANCY IN THE PAST 3 MONTHS Performed at [place] [address], [place], [place] [address]    MRSA, PCR 09/10/2021 NEGATIVE  NEGATIVE Final   Staphylococcus aureus 09/10/2021 NEGATIVE  NEGATIVE Final   Comment: (NOTE) The Xpert SA Assay (FDA approved for NASAL specimens in patients 76 years of age and older), is one component of a comprehensive surveillance program. It is not intended to diagnose infection nor to guide or monitor treatment. Performed at [facility], [place] [address] [place], [place] [address]      X-Rays:DG Pelvis Portable  Result Date: 09/21/2021 CLINICAL DATA:  Post total hip arthroplasty EXAM: PORTABLE PELVIS 1-2 VIEWS COMPARISON:  Portable exam 1758 hours compared to intraoperative images of 09/21/2021 FINDINGS: RIGHT hip prosthesis identified in expected position. No acute fracture, dislocation, or bone  destruction. Osseous mineralization normal. Numerous pelvic phleboliths. IMPRESSION: RIGHT hip prosthesis without acute abnormalities. Electronically Signed   By: Lavonia Dana M.D.   On: 09/21/2021 18:45   DG C-Arm 1-60 Min-No Report  Result Date: 09/21/2021 Fluoroscopy was utilized by the requesting physician.  No radiographic interpretation.   DG C-Arm 1-60 Min-No Report  Result Date: 09/21/2021 Fluoroscopy was utilized by the requesting physician.  No radiographic interpretation.   DG HIP UNILAT WITH PELVIS 2-3 VIEWS RIGHT  Result Date: 09/21/2021 CLINICAL DATA:  Hip replacement EXAM: DG HIP (WITH OR WITHOUT PELVIS) 2-3V RIGHT COMPARISON:  None. FINDINGS: Four low resolution intraoperative spot views of the right hip. Total fluoroscopy time was 15 seconds. The images were obtained during operative course of right hip replacement. IMPRESSION: Intraoperative fluoroscopic assistance provided during right hip replacement surgery Electronically Signed   By: Donavan Foil M.D.   On: 09/21/2021 17:39    EKG: Orders placed or performed in visit on 11/19/19   EKG 12-Lead     Hospital Course: Chase Knapp is a 75 y.o. who was admitted to [facility]. They were brought to the operating room on 09/21/2021 and underwent Procedure(s): [place].  Patient tolerated the procedure well and was later transferred to the recovery room and then to the orthopaedic floor for postoperative care. They were given PO and IV analgesics for pain control following their surgery. They were given 24 hours of postoperative antibiotics of  Anti-infectives (From admission, onward)    Start     Dose/Rate Route Frequency Ordered Stop   09/21/21 2200  ceFAZolin (ANCEF) IVPB 2g/100 mL premix        2 g 200 mL/hr over 30 Minutes Intravenous Every 6 hours 09/21/21 1851 09/22/21 0736   09/21/21 1321  ceFAZolin (ANCEF) 2-4 GM/100ML-% IVPB       Note to Pharmacy:  Randa Evens D:  cabinet override      09/21/21 1321 09/22/21 0129   09/21/21 1315  ceFAZolin (ANCEF) IVPB 2g/100 mL premix        2 g 200 mL/hr over 30 Minutes Intravenous On call to O.R. 09/21/21 1307 09/21/21 1536      and started on DVT prophylaxis in the form of Aspirin.   PT and OT were ordered for total joint protocol. Discharge planning consulted to help with postop disposition and equipment needs.  Patient had a good night on the evening of surgery. They started to get up OOB with therapy on POD #1. Pt was seen during rounds and was ready to go home pending progress with therapy.He worked with therapy on POD #1 and was meeting his goals. Pt was discharged to home later that day in stable condition.  Diet: Regular diet Activity: WBAT Follow-up: in 2 weeks Disposition: Home Discharged Condition: good   Discharge Instructions     Call MD / Call 911   Complete by: As directed    If you experience chest pain or shortness of breath, CALL 911 and be transported to the hospital emergency room.  If you develope a fever above 101 F, pus (white drainage) or increased drainage or redness at the wound, or calf pain, call your surgeon's office.   Change dressing   Complete by: As directed    Maintain surgical dressing until follow up in the clinic. If the edges start to pull up, may reinforce with tape. If the dressing is no longer working, may remove and cover with gauze and tape, but must keep the area dry and clean.  Call with any questions or concerns.   Constipation Prevention   Complete by: As directed    Drink plenty of fluids.  Prune juice may be helpful.  You may use a stool softener, such as Colace (over the counter) 100 mg twice a day.  Use MiraLax (over the counter) for constipation as needed.   Diet - low sodium heart healthy   Complete by: As directed    Increase activity slowly as tolerated   Complete by: As directed    Weight bearing as tolerated with assist device (walker, cane, etc) as  directed, use it as long as suggested by your surgeon or therapist, typically at least 4-6 weeks.   Post-operative opioid taper instructions:   Complete by: As directed    POST-OPERATIVE OPIOID TAPER INSTRUCTIONS: It is important to wean off of your opioid medication as soon as possible. If you do not need pain medication after your surgery it is ok to stop day one. Opioids include: Codeine, Hydrocodone(Norco, Vicodin), Oxycodone(Percocet, oxycontin) and hydromorphone amongst others.  Long term and even short term use of opiods can cause: Increased pain response Dependence Constipation Depression Respiratory depression And more.  Withdrawal symptoms can include Flu like symptoms Nausea, vomiting And more Techniques to manage these symptoms Hydrate well Eat regular healthy meals Stay active Use relaxation techniques(deep breathing, meditating, yoga) Do Not substitute Alcohol to help with tapering If you have been on opioids for less than two weeks and do not have pain than it is ok to stop all together.  Plan to wean off of opioids This plan should start within one week post op of your joint replacement. Maintain the same interval or time between taking each dose and first decrease the dose.  Cut the total daily intake of opioids by one tablet each day Next start to increase  the time between doses. The last dose that should be eliminated is the evening dose.      TED hose   Complete by: As directed    Use stockings (TED hose) for 2 weeks on both leg(s).  You may remove them at night for sleeping.      Allergies as of 09/22/2021       Reactions   Ibuprofen Itching, Rash        Medication List     STOP taking these medications    diclofenac 75 MG EC tablet Commonly known as: VOLTAREN   FISH OIL PO   Xarelto 20 MG Tabs tablet Generic drug: rivaroxaban       TAKE these medications    aspirin 81 MG chewable tablet Chew 1 tablet (81 mg total) by mouth 2  (two) times daily for 28 days.   celecoxib 200 MG capsule Commonly known as: CELEBREX Take 1 capsule (200 mg total) by mouth 2 (two) times daily.   docusate sodium 100 MG capsule Commonly known as: COLACE Take 1 capsule (100 mg total) by mouth 2 (two) times daily.   glucosamine-chondroitin 500-400 MG tablet Take 1 tablet by mouth 3 (three) times a week.   HYDROcodone-acetaminophen 5-325 MG tablet Commonly known as: NORCO/VICODIN Take 1-2 tablets by mouth every 6 (six) hours as needed for severe pain.   methocarbamol 500 MG tablet Commonly known as: ROBAXIN Take 1 tablet (500 mg total) by mouth every 6 (six) hours as needed for muscle spasms.   multivitamin with minerals tablet Take 1 tablet by mouth daily.   polyethylene glycol 17 g packet Commonly known as: MIRALAX / GLYCOLAX Take 17 g by mouth daily as needed for mild constipation.   VITAMIN C PO Take 1 tablet by mouth 3 (three) times a week.   VITAMIN D3 PO Take 1 tablet by mouth daily.   vitamin E 180 MG (400 UNITS) capsule Take 400 Units by mouth 3 (three) times a week.   zinc gluconate 50 MG tablet Take 50 mg by mouth 3 (three) times a week.               Discharge Care Instructions  (From admission, onward)           Start     Ordered   09/22/21 0000  Change dressing       Comments: Maintain surgical dressing until follow up in the clinic. If the edges start to pull up, may reinforce with tape. If the dressing is no longer working, may remove and cover with gauze and tape, but must keep the area dry and clean.  Call with any questions or concerns.   09/22/21 8338            Follow-up Information     Paralee Cancel, MD. Go on 10/08/2021.   Specialty: Orthopedic Surgery Why: You are scheduled for a follow up appointment on 10-08-21 at 8:15 am. Contact information: [address] [place] [address] [phone]                 Signed: Griffith Citron,  PA-C Orthopedic Surgery 10/05/2021, 2:40 PM

## 2021-11-10 DIAGNOSIS — M25551 Pain in right hip: Secondary | ICD-10-CM | POA: Diagnosis not present

## 2021-11-10 DIAGNOSIS — M170 Bilateral primary osteoarthritis of knee: Secondary | ICD-10-CM | POA: Diagnosis not present

## 2021-12-03 IMAGING — XA DG HIP (WITH OR WITHOUT PELVIS) 2-3V*R*
1 series · 8 of 8 positions shown · non-contrast
Comparison: None.

CLINICAL DATA: Hip replacement

EXAM:
DG HIP (WITH OR WITHOUT PELVIS) 2-3V RIGHT

[Series 1: unknown protocol · 8 of 8 slices shown]
[im 1/8]
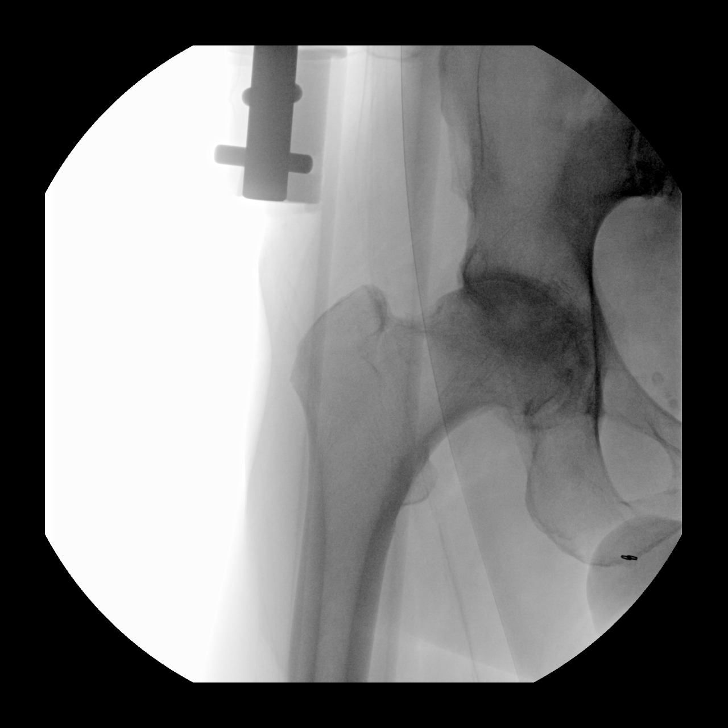
[im 2/8]
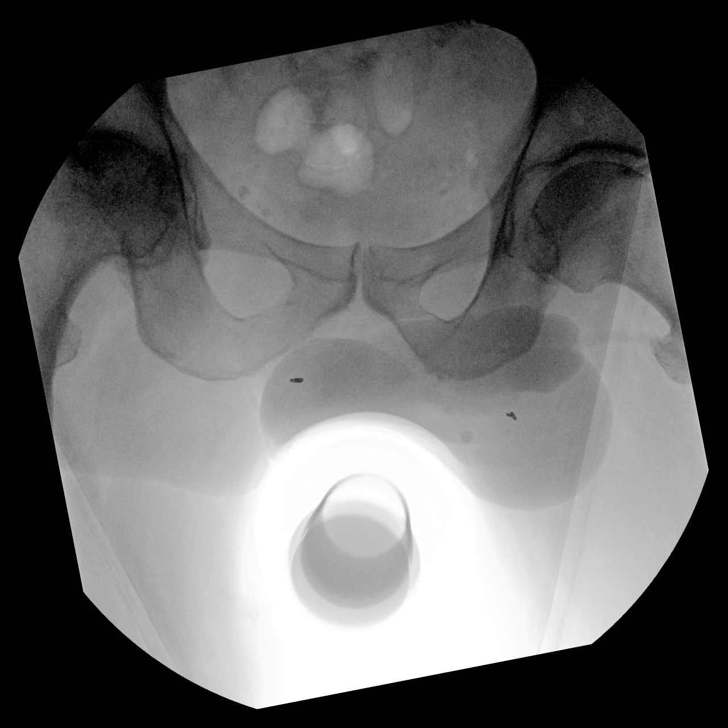
[im 3/8]
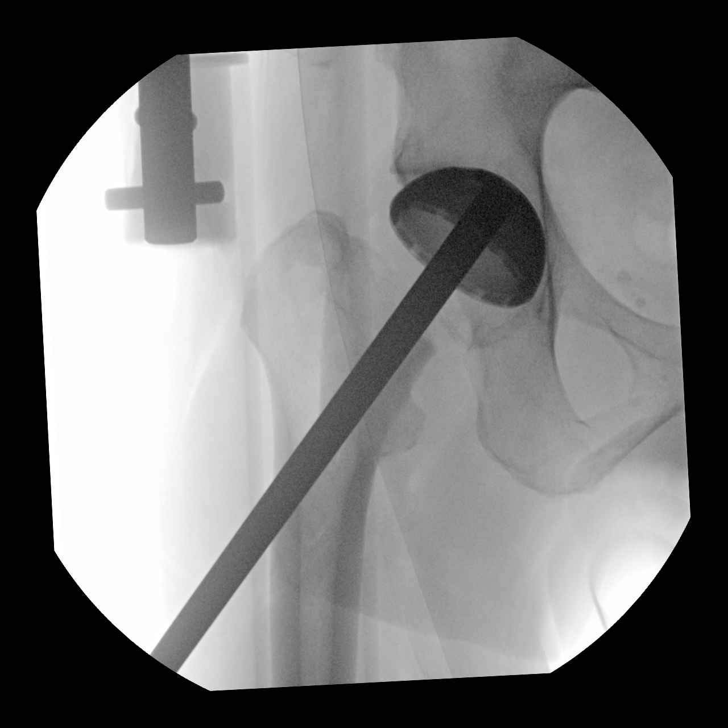
[im 4/8]
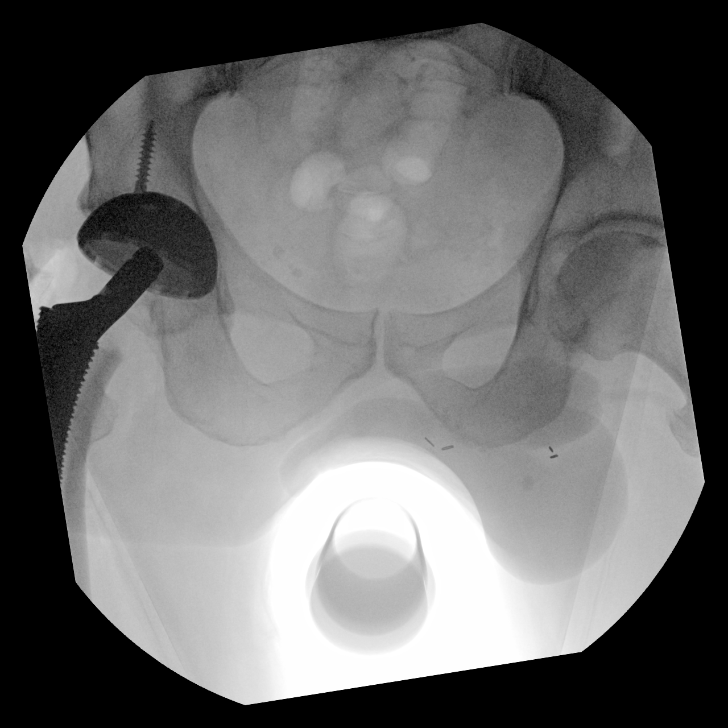
[im 5/8]
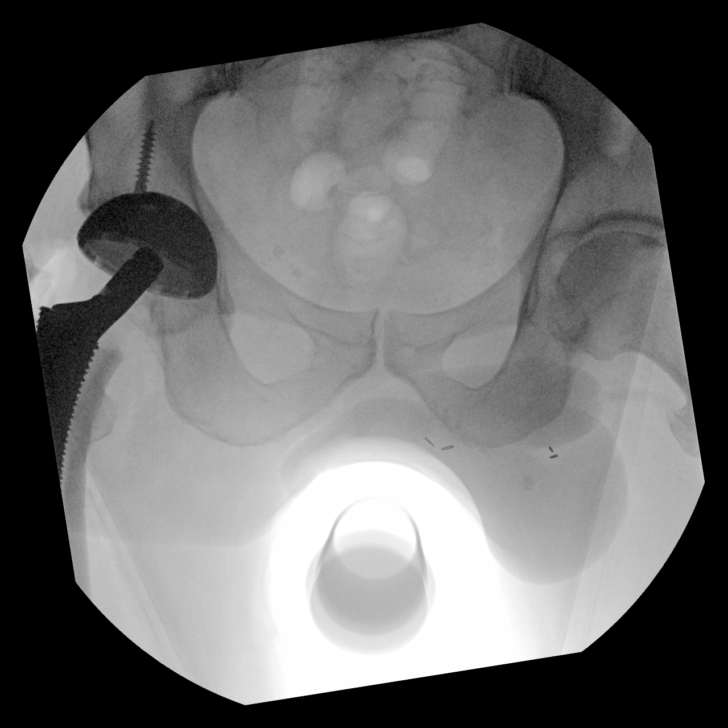
[im 6/8]
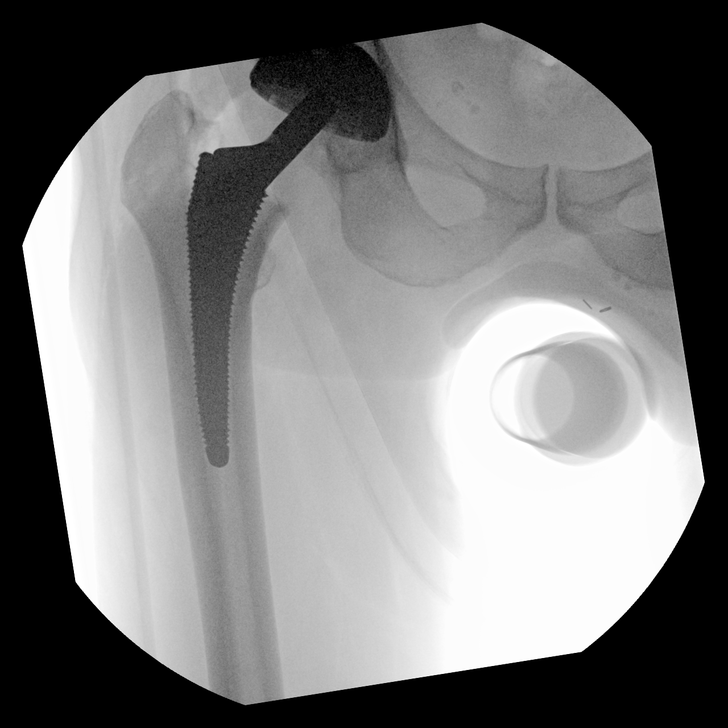
[im 7/8]
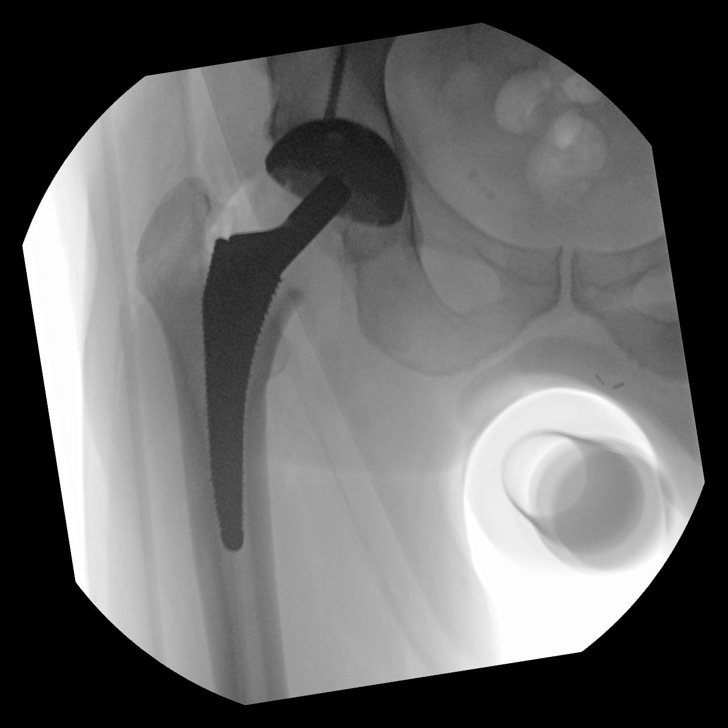
[im 8/8]
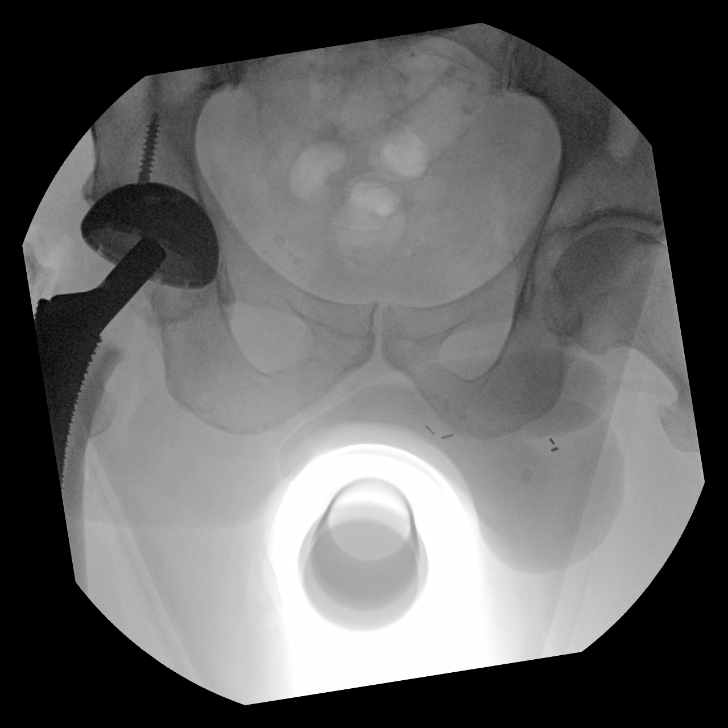

[8 of 8 positions shown; findings below may reference images not displayed]

FINDINGS: Four low resolution intraoperative spot views of the right hip.
Total fluoroscopy time was 15 seconds. The images were obtained
during operative course of right hip replacement.
IMPRESSION: Intraoperative fluoroscopic assistance provided during right hip
replacement surgery

## 2022-03-14 DIAGNOSIS — M170 Bilateral primary osteoarthritis of knee: Secondary | ICD-10-CM | POA: Diagnosis not present

## 2022-04-29 DIAGNOSIS — Z8679 Personal history of other diseases of the circulatory system: Secondary | ICD-10-CM | POA: Diagnosis not present

## 2022-04-29 DIAGNOSIS — I4819 Other persistent atrial fibrillation: Secondary | ICD-10-CM | POA: Diagnosis not present

## 2022-04-29 DIAGNOSIS — I712 Thoracic aortic aneurysm, without rupture, unspecified: Secondary | ICD-10-CM | POA: Diagnosis not present

## 2022-04-29 DIAGNOSIS — Z9889 Other specified postprocedural states: Secondary | ICD-10-CM | POA: Diagnosis not present

## 2022-05-02 DIAGNOSIS — M25562 Pain in left knee: Secondary | ICD-10-CM | POA: Diagnosis not present

## 2022-05-02 DIAGNOSIS — M1712 Unilateral primary osteoarthritis, left knee: Secondary | ICD-10-CM | POA: Diagnosis not present

## 2022-05-02 DIAGNOSIS — M25561 Pain in right knee: Secondary | ICD-10-CM | POA: Diagnosis not present

## 2022-05-02 DIAGNOSIS — M170 Bilateral primary osteoarthritis of knee: Secondary | ICD-10-CM | POA: Diagnosis not present

## 2022-05-02 DIAGNOSIS — M1711 Unilateral primary osteoarthritis, right knee: Secondary | ICD-10-CM | POA: Diagnosis not present

## 2022-05-16 DIAGNOSIS — I4819 Other persistent atrial fibrillation: Secondary | ICD-10-CM | POA: Diagnosis not present

## 2022-05-16 DIAGNOSIS — Z9889 Other specified postprocedural states: Secondary | ICD-10-CM | POA: Diagnosis not present

## 2022-05-16 DIAGNOSIS — Z8679 Personal history of other diseases of the circulatory system: Secondary | ICD-10-CM | POA: Diagnosis not present

## 2022-05-16 DIAGNOSIS — I712 Thoracic aortic aneurysm, without rupture, unspecified: Secondary | ICD-10-CM | POA: Diagnosis not present

## 2022-05-26 DIAGNOSIS — Z8679 Personal history of other diseases of the circulatory system: Secondary | ICD-10-CM | POA: Diagnosis not present

## 2022-05-26 DIAGNOSIS — M1711 Unilateral primary osteoarthritis, right knee: Secondary | ICD-10-CM | POA: Diagnosis not present

## 2022-06-02 DIAGNOSIS — H25813 Combined forms of age-related cataract, bilateral: Secondary | ICD-10-CM | POA: Diagnosis not present

## 2022-06-15 NOTE — Patient Instructions (Signed)
SURGICAL WAITING ROOM VISITATION Patients having surgery or a procedure may have no more than 2 support people in the waiting area - these visitors may rotate.   Children under the age of 53 must have an adult with them who is not the patient. If the patient needs to stay at the hospital during part of their recovery, the visitor guidelines for inpatient rooms apply. Pre-op nurse will coordinate an appropriate time for 1 support person to accompany patient in pre-op.  This support person may not rotate.    Please refer to the [facility] website for the visitor guidelines for Inpatients (after your surgery is over and you are in a regular room).       Your procedure is scheduled on:  06/30/2022    Report to [facility] Main Entrance    Report to admitting at   [phone]   Call this number if you have problems the morning of surgery [phone]   Do not eat food :After Midnight.   After Midnight you may have the following liquids until __ 0415____ AM  DAY OF SURGERY  Water Non-Citrus Juices (without pulp, NO RED) Carbonated Beverages Black Coffee (NO MILK/CREAM OR CREAMERS, sugar ok)  Clear Tea (NO MILK/CREAM OR CREAMERS, sugar ok) regular and decaf                             Plain Jell-O (NO RED)                                           Fruit ices (not with fruit pulp, NO RED)                                     Popsicles (NO RED)                                                               Sports drinks like Gatorade (NO RED)                   The day of surgery:  Drink ONE (1) Pre-Surgery Clear Ensure or G2 at 0415  AM ( have completed by )  the morning of surgery. Drink in one sitting. Do not sip.  This drink was given to you during your hospital  pre-op appointment visit. Nothing else to drink after completing the  Pre-Surgery Clear Ensure or G2.         Oral Hygiene is also important to reduce your risk of infection.                                     Remember - BRUSH YOUR TEETH THE MORNING OF SURGERY WITH YOUR REGULAR TOOTHPASTE   Do NOT smoke after Midnight   Take these medicines the morning of surgery with A SIP OF WATER:  none   DO NOT TAKE ANY ORAL DIABETIC MEDICATIONS DAY OF YOUR SURGERY  Bring CPAP mask and tubing day of  surgery.                              You may not have any metal on your body including hair pins, jewelry, and body piercing             Do not wear make-up, lotions, powders, perfumes/cologne, or deodorant  Do not wear nail polish including gel and S&S, artificial/acrylic nails, or any other type of covering on natural nails including finger and toenails. If you have artificial nails, gel coating, etc. that needs to be removed by a nail salon please have this removed prior to surgery or surgery may need to be canceled/ delayed if the surgeon/ anesthesia feels like they are unable to be safely monitored.   Do not shave  48 hours prior to surgery.               Men may shave face and neck.   Do not bring valuables to the hospital. [place].   Contacts, dentures or bridgework may not be worn into surgery.   Bring small overnight bag day of surgery.   DO NOT [place]. PHARMACY WILL DISPENSE MEDICATIONS LISTED ON YOUR MEDICATION LIST TO YOU DURING YOUR ADMISSION [place]!    Patients discharged on the day of surgery will not be allowed to drive home.  Someone NEEDS to stay with you for the first 24 hours after anesthesia.   Special Instructions: Bring a copy of your healthcare power of attorney and living will documents         the day of surgery if you haven't scanned them before.              Please read over the following fact sheets you were given: IF YOU HAVE QUESTIONS ABOUT YOUR PRE-OP INSTRUCTIONS PLEASE CALL [phone]     [facility] - Preparing for Surgery Before surgery, you can play an important  role.  Because skin is not sterile, your skin needs to be as free of germs as possible.  You can reduce the number of germs on your skin by washing with CHG (chlorahexidine gluconate) soap before surgery.  CHG is an antiseptic cleaner which kills germs and bonds with the skin to continue killing germs even after washing. Please DO NOT use if you have an allergy to CHG or antibacterial soaps.  If your skin becomes reddened/irritated stop using the CHG and inform your nurse when you arrive at Short Stay. Do not shave (including legs and underarms) for at least 48 hours prior to the first CHG shower.  You may shave your face/neck. Please follow these instructions carefully:  1.  Shower with CHG Soap the night before surgery and the  morning of Surgery.  2.  If you choose to wash your hair, wash your hair first as usual with your  normal  shampoo.  3.  After you shampoo, rinse your hair and body thoroughly to remove the  shampoo.                           4.  Use CHG as you would any other liquid soap.  You can apply chg directly  to the skin and wash  Gently with a scrungie or clean washcloth.  5.  Apply the CHG Soap to your body ONLY FROM THE NECK DOWN.   Do not use on face/ open                           Wound or open sores. Avoid contact with eyes, ears mouth and genitals (private parts).                       Wash face,  Genitals (private parts) with your normal soap.             6.  Wash thoroughly, paying special attention to the area where your surgery  will be performed.  7.  Thoroughly rinse your body with warm water from the neck down.  8.  DO NOT shower/wash with your normal soap after using and rinsing off  the CHG Soap.                9.  Pat yourself dry with a clean towel.            10.  Wear clean pajamas.            11.  Place clean sheets on your bed the night of your first shower and do not  sleep with pets. Day of Surgery : Do not apply any lotions/deodorants  the morning of surgery.  Please wear clean clothes to the hospital/surgery center.  FAILURE TO FOLLOW THESE INSTRUCTIONS MAY RESULT IN THE CANCELLATION OF YOUR SURGERY PATIENT SIGNATURE_________________________________  NURSE SIGNATURE__________________________________  ________________________________________________________________________

## 2022-06-15 NOTE — Progress Notes (Addendum)
Anesthesia Review:  PCP: DR Prochnau- clearance on chart dated 05/26/22 LOV 05/26/22 on chart  Cardiologist : Norm Salt - [place] 04/29/22  Chest x-ray : EKG : 04/29/22- [facility]-  have requested 12 lead ekg tracing by fax  Echo : 05/16/22  Stress test: Cardiac Cath :  2021  Ablation - 2021  Activity level: can do a flight of stair without difficutgly  Sleep Study/ CPAP :none  Fasting Blood Sugar :      / Checks Blood Sugar -- times a day:   Blood Thinner/ Instructions /Last Dose: ASA / Instructions/ Last Dose :   On OR schedule- left knee is scheduled.  PT states left knee preop.   On consent  order listed as right knee.  Called and LVMM for sherry at [facility] in regards to wrong order in epic .   Consent order corrected on 06/20/22.  Bmp DONE 06/20/22 routed to Dr Alvan Dame. Potassium-5.6. Moderate hemolysis on lab.  Lyndon Code, [facility] aware.  BMp order placed in epic for am of surgery per her order.

## 2022-06-20 ENCOUNTER — Other Ambulatory Visit: Payer: Self-pay

## 2022-06-20 ENCOUNTER — Encounter (HOSPITAL_COMMUNITY): Payer: Self-pay

## 2022-06-20 ENCOUNTER — Encounter (HOSPITAL_COMMUNITY)
Admission: RE | Admit: 2022-06-20 | Discharge: 2022-06-20 | Disposition: A | Discharge status: Home / Self Care | Payer: Medicare Other | Source: Ambulatory Visit | Attending: Orthopedic Surgery | Admitting: Orthopedic Surgery

## 2022-06-20 DIAGNOSIS — M1711 Unilateral primary osteoarthritis, right knee: Secondary | ICD-10-CM | POA: Diagnosis not present

## 2022-06-20 DIAGNOSIS — Z01818 Encounter for other preprocedural examination: Secondary | ICD-10-CM

## 2022-06-20 LAB — CBC
HCT: 41.5 % (ref 39.0–52.0)
Hemoglobin: 13.8 g/dL (ref 13.0–17.0)
MCH: 31.2 pg (ref 26.0–34.0)
MCHC: 33.3 g/dL (ref 30.0–36.0)
MCV: 93.7 fL (ref 80.0–100.0)
Platelets: 185 10*3/uL (ref 150–400)
RBC: 4.43 MIL/uL (ref 4.22–5.81)
RDW: 12.2 % (ref 11.5–15.5)
WBC: 5.2 10*3/uL (ref 4.0–10.5)
nRBC: 0 % (ref 0.0–0.2)

## 2022-06-20 LAB — BASIC METABOLIC PANEL
Anion gap: 8 (ref 5–15)
BUN: 18 mg/dL (ref 8–23)
CO2: 23 mmol/L (ref 22–32)
Calcium: 9.4 mg/dL (ref 8.9–10.3)
Chloride: 106 mmol/L (ref 98–111)
Creatinine, Ser: 0.92 mg/dL (ref 0.61–1.24)
GFR, Estimated: >60 mL/min (ref >60)
Glucose, Bld: 85 mg/dL (ref 70–99)
Potassium: 5.6 mmol/L — ABNORMAL HIGH (ref 3.5–5.1)
Sodium: 137 mmol/L (ref 135–145)

## 2022-06-20 LAB — SURGICAL PCR SCREEN
MRSA, PCR: NEGATIVE
Staphylococcus aureus: NEGATIVE

## 2022-06-23 NOTE — H&P (Signed)
TOTAL KNEE ADMISSION H&P  Patient is being admitted for left total knee arthroplasty.  Subjective:  Chief Complaint:left knee pain.  HPI: Chase Knapp, 76 y.o. male, has a history of pain and functional disability in the left knee due to arthritis and has failed non-surgical conservative treatments for greater than 12 weeks to includeNSAID's and/or analgesics, corticosteriod injections, and activity modification.  Onset of symptoms was gradual, starting 2 years ago with gradually worsening course since that time. The patient noted no past surgery on the left knee(s).  Patient currently rates pain in the left knee(s) at 8 out of 10 with activity. Patient has worsening of pain with activity and weight bearing, pain that interferes with activities of daily living, and pain with passive range of motion.  Patient has evidence of joint space narrowing by imaging studies. There is no active infection.  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   S/P total hip arthroplasty 09/21/2021   Past Medical History:  Diagnosis Date   Cancer ([place]) 2006   skin on Lt upper arm   Dysrhythmia 2020   A-Fib   Hypertriglyceridemia    MRSA infection    Right knee prolonged IV antibiotics   Osteoarthritis    Varicose veins of both lower extremities     Past Surgical History:  Procedure Laterality Date   [place] Left 2006   arm   TOTAL HIP ARTHROPLASTY Right 09/21/2021   Procedure: TOTAL HIP ARTHROPLASTY ANTERIOR APPROACH;  Surgeon: Paralee Cancel, MD;  Location: [facility];  Service: Orthopedics;  Laterality: Right;    No current facility-administered medications for this encounter.   Current Outpatient Medications  Medication Sig Dispense Refill Last Dose   Ascorbic Acid (VITAMIN C) 1000 MG tablet Take 1,000 mg by mouth 3 (three) times a week.      Calcium Carbonate-Vitamin D (CALCIUM 600+D PO) Take  1 tablet by mouth 3 (three) times a week.      Cholecalciferol (VITAMIN D) 50 MCG (2000 UT) tablet Take 2,000 Units by mouth 3 (three) times a week.      cyanocobalamin (VITAMIN B12) 500 MCG tablet Take 500 mcg by mouth 3 (three) times a week.      Misc Natural Products (GLUCOSAMINE CHONDROITIN TRIPLE) TABS Take 1 tablet by mouth 3 (three) times a week.      Multiple Vitamin (MULTIVITAMIN WITH MINERALS) TABS tablet Take 1 tablet by mouth 3 (three) times a week.      Omega-3 Fatty Acids (FISH OIL) 1200 MG CAPS Take 1,200 mg by mouth 3 (three) times a week.      vitamin E 180 MG (400 UNITS) capsule Take 400 Units by mouth 3 (three) times a week.      Zinc 25 MG TABS Take 25 mg by mouth 3 (three) times a week.      Allergies  Allergen Reactions   Ibuprofen Itching and Rash    Social History   Tobacco Use   Smoking status: Never   Smokeless tobacco: Never  Substance Use Topics   Alcohol use: Never    No family history on file.   Review of Systems  Constitutional:  Negative for chills and fever.  Respiratory:  Negative for cough and shortness of breath.   Cardiovascular:  Negative for chest pain.  Gastrointestinal:  Negative for nausea and vomiting.  Musculoskeletal:  Positive for arthralgias.  Objective:  Physical Exam Well nourished and well developed. General: Alert and oriented x3, cooperative and pleasant, no acute distress. Head: normocephalic, atraumatic, neck supple. Eyes: EOMI.  Musculoskeletal: Bilateral knee exams: No palpable effusions, warmth or erythema Slight flexion contractures associated with bilateral genu varum Tenderness medially and anteriorly Stable and intact medial and lateral collateral ligaments No lower extremity edema, erythema or calf tenderness  Calves soft and nontender. Motor function intact in LE. Strength 5/5 LE bilaterally. Neuro: Distal pulses 2+. Sensation to light touch intact in LE.  Vital signs in last 24 hours:     Labs:   Estimated body mass index is 30.71 kg/m as calculated from the following:   Height as of 06/20/22: 5' 7.5" (1.715 m).   Weight as of 06/20/22: 90.3 kg.   Imaging Review Plain radiographs demonstrate severe degenerative joint disease of the left knee(s). The overall alignment isneutral. The bone quality appears to be adequate for age and reported activity level.      Assessment/Plan:  End stage arthritis, left knee   The patient history, physical examination, clinical judgment of the provider and imaging studies are consistent with end stage degenerative joint disease of the left knee(s) and total knee arthroplasty is deemed medically necessary. The treatment options including medical management, injection therapy arthroscopy and arthroplasty were discussed at length. The risks and benefits of total knee arthroplasty were presented and reviewed. The risks due to aseptic loosening, infection, stiffness, patella tracking problems, thromboembolic complications and other imponderables were discussed. The patient acknowledged the explanation, agreed to proceed with the plan and consent was signed. Patient is being admitted for inpatient treatment for surgery, pain control, PT, OT, prophylactic antibiotics, VTE prophylaxis, progressive ambulation and ADL's and discharge planning. The patient is planning to be discharged  home.  Therapy Plans: outpatient therapy at [facility] Disposition: Home with wife Planned DVT Prophylaxis: aspirin '81mg'$  BID DME needed: none PCP: Dr. Laqueta Due - clearance received Cardiologist: Dr. Norm Salt ([place]) - had echo/clearance per PCP TXA: IV Allergies: aleve - rash Anesthesia Concerns: none BMI: 29.6 Last HgbA1c: Not diabetic   Other: - oxycodone, robaxin, tylenol, celebrex   Patient's anticipated LOS is less than 2 midnights, meeting these requirements: - Younger than 1 - Lives within 1 hour of care - Has a competent adult at home to recover  with post-op recover - NO history of  - Chronic pain requiring opiods  - Diabetes  - Coronary Artery Disease  - Heart failure  - Heart attack  - Stroke  - DVT/VTE  - Cardiac arrhythmia  - Respiratory Failure/COPD  - Renal failure  - Anemia  - Advanced Liver disease  Costella Hatcher, PA-C Orthopedic Surgery [facility] [place] [phone]

## 2022-06-30 ENCOUNTER — Ambulatory Visit (HOSPITAL_COMMUNITY): Payer: Medicare Other | Admitting: Physician Assistant

## 2022-06-30 ENCOUNTER — Observation Stay (HOSPITAL_COMMUNITY)
Admission: RE | Admit: 2022-06-30 | Discharge: 2022-07-01 | Disposition: A | Discharge status: Home / Self Care | Payer: Medicare Other | Attending: Orthopedic Surgery | Admitting: Orthopedic Surgery

## 2022-06-30 ENCOUNTER — Encounter (HOSPITAL_COMMUNITY): Payer: Self-pay | Admitting: Orthopedic Surgery

## 2022-06-30 ENCOUNTER — Ambulatory Visit (HOSPITAL_BASED_OUTPATIENT_CLINIC_OR_DEPARTMENT_OTHER): Payer: Medicare Other | Admitting: Anesthesiology

## 2022-06-30 ENCOUNTER — Other Ambulatory Visit: Payer: Self-pay

## 2022-06-30 ENCOUNTER — Encounter (HOSPITAL_COMMUNITY)
Admission: RE | Disposition: A | Discharge status: Home / Self Care | Payer: Self-pay | Source: Home / Self Care | Attending: Orthopedic Surgery

## 2022-06-30 DIAGNOSIS — M1711 Unilateral primary osteoarthritis, right knee: Secondary | ICD-10-CM

## 2022-06-30 DIAGNOSIS — M1712 Unilateral primary osteoarthritis, left knee: Secondary | ICD-10-CM | POA: Diagnosis not present

## 2022-06-30 DIAGNOSIS — I4891 Unspecified atrial fibrillation: Secondary | ICD-10-CM | POA: Insufficient documentation

## 2022-06-30 DIAGNOSIS — Z96641 Presence of right artificial hip joint: Secondary | ICD-10-CM | POA: Insufficient documentation

## 2022-06-30 DIAGNOSIS — Z85828 Personal history of other malignant neoplasm of skin: Secondary | ICD-10-CM | POA: Insufficient documentation

## 2022-06-30 DIAGNOSIS — G8918 Other acute postprocedural pain: Secondary | ICD-10-CM | POA: Diagnosis not present

## 2022-06-30 DIAGNOSIS — M659 Synovitis and tenosynovitis, unspecified: Secondary | ICD-10-CM | POA: Diagnosis not present

## 2022-06-30 DIAGNOSIS — Z79899 Other long term (current) drug therapy: Secondary | ICD-10-CM | POA: Insufficient documentation

## 2022-06-30 DIAGNOSIS — M25462 Effusion, left knee: Secondary | ICD-10-CM | POA: Diagnosis not present

## 2022-06-30 DIAGNOSIS — Z96652 Presence of left artificial knee joint: Secondary | ICD-10-CM

## 2022-06-30 DIAGNOSIS — Z01818 Encounter for other preprocedural examination: Secondary | ICD-10-CM

## 2022-06-30 HISTORY — PX: TOTAL KNEE ARTHROPLASTY: SHX125

## 2022-06-30 HISTORY — DX: Personal history of other medical treatment: Z92.89

## 2022-06-30 HISTORY — DX: Other specified health status: Z78.9

## 2022-06-30 LAB — BASIC METABOLIC PANEL
Anion gap: 7 (ref 5–15)
BUN: 19 mg/dL (ref 8–23)
CO2: 22 mmol/L (ref 22–32)
Calcium: 8.6 mg/dL — ABNORMAL LOW (ref 8.9–10.3)
Chloride: 108 mmol/L (ref 98–111)
Creatinine, Ser: 0.89 mg/dL (ref 0.61–1.24)
GFR, Estimated: >60 mL/min (ref >60)
Glucose, Bld: 117 mg/dL — ABNORMAL HIGH (ref 70–99)
Potassium: 3.7 mmol/L (ref 3.5–5.1)
Sodium: 137 mmol/L (ref 135–145)

## 2022-06-30 LAB — TYPE AND SCREEN
ABO/RH(D): A POS
Antibody Screen: NEGATIVE

## 2022-06-30 SURGERY — ARTHROPLASTY, KNEE, TOTAL
Anesthesia: Monitor Anesthesia Care | Site: Knee | Laterality: Left

## 2022-06-30 MED ORDER — OXYCODONE HCL 5 MG PO TABS: 5.0000 mg | ORAL_TABLET | Freq: Once | ORAL | Status: DC | PRN

## 2022-06-30 MED ORDER — METOCLOPRAMIDE HCL 5 MG/ML IJ SOLN: 5.0000 mg | Freq: Three times a day (TID) | INTRAMUSCULAR | Status: DC | PRN

## 2022-06-30 MED ORDER — FENTANYL CITRATE PF 50 MCG/ML IJ SOSY: 25.0000 ug | PREFILLED_SYRINGE | INTRAMUSCULAR | Status: DC | PRN

## 2022-06-30 MED ORDER — OXYCODONE HCL 5 MG PO TABS
10.0000 mg | ORAL_TABLET | ORAL | Status: DC | PRN
  Administered 2022-06-30: 15 mg via ORAL
  Filled 2022-06-30: qty 3
  Filled 2022-06-30: qty 2
  Filled 2022-06-30: qty 3

## 2022-06-30 MED ORDER — DEXAMETHASONE SODIUM PHOSPHATE 10 MG/ML IJ SOLN
INTRAMUSCULAR | Status: AC
  Filled 2022-06-30: qty 1

## 2022-06-30 MED ORDER — SODIUM CHLORIDE 0.9 % IV SOLN: INTRAVENOUS | Status: DC

## 2022-06-30 MED ORDER — ACETAMINOPHEN 10 MG/ML IV SOLN: 1000.0000 mg | Freq: Once | INTRAVENOUS | Status: DC | PRN

## 2022-06-30 MED ORDER — MENTHOL 3 MG MT LOZG: 1.0000 | LOZENGE | OROMUCOSAL | Status: DC | PRN

## 2022-06-30 MED ORDER — LACTATED RINGERS IV SOLN: INTRAVENOUS | Status: DC

## 2022-06-30 MED ORDER — SODIUM CHLORIDE 0.9 % IR SOLN
Status: DC | PRN
  Administered 2022-06-30: 1000 mL

## 2022-06-30 MED ORDER — METHOCARBAMOL 500 MG PO TABS
500.0000 mg | ORAL_TABLET | Freq: Four times a day (QID) | ORAL | Status: DC | PRN
  Administered 2022-06-30 – 2022-07-01 (×3): 500 mg via ORAL
  Filled 2022-06-30 (×3): qty 1

## 2022-06-30 MED ORDER — MIDAZOLAM HCL 2 MG/2ML IJ SOLN
INTRAMUSCULAR | Status: AC
  Filled 2022-06-30: qty 2

## 2022-06-30 MED ORDER — FENTANYL CITRATE (PF) 100 MCG/2ML IJ SOLN
INTRAMUSCULAR | Status: DC | PRN
  Administered 2022-06-30 (×2): 50 ug via INTRAVENOUS

## 2022-06-30 MED ORDER — ONDANSETRON HCL 4 MG PO TABS: 4.0000 mg | ORAL_TABLET | Freq: Four times a day (QID) | ORAL | Status: DC | PRN

## 2022-06-30 MED ORDER — BISACODYL 10 MG RE SUPP: 10.0000 mg | Freq: Every day | RECTAL | Status: DC | PRN

## 2022-06-30 MED ORDER — PROPOFOL 10 MG/ML IV BOLUS
INTRAVENOUS | Status: DC | PRN
  Administered 2022-06-30: 20 mg via INTRAVENOUS

## 2022-06-30 MED ORDER — SODIUM CHLORIDE (PF) 0.9 % IJ SOLN
INTRAMUSCULAR | Status: DC | PRN
  Administered 2022-06-30: 30 mL

## 2022-06-30 MED ORDER — DEXAMETHASONE SODIUM PHOSPHATE 10 MG/ML IJ SOLN
10.0000 mg | Freq: Once | INTRAMUSCULAR | Status: AC
  Administered 2022-07-01: 10 mg via INTRAVENOUS
  Filled 2022-06-30: qty 1

## 2022-06-30 MED ORDER — PROPOFOL 500 MG/50ML IV EMUL
INTRAVENOUS | Status: DC | PRN
  Administered 2022-06-30: 50 ug/kg/min via INTRAVENOUS

## 2022-06-30 MED ORDER — POLYETHYLENE GLYCOL 3350 17 G PO PACK: 17.0000 g | PACK | Freq: Every day | ORAL | Status: DC | PRN

## 2022-06-30 MED ORDER — BUPIVACAINE-EPINEPHRINE (PF) 0.5% -1:200000 IJ SOLN
INTRAMUSCULAR | Status: DC | PRN
  Administered 2022-06-30: 20 mL via PERINEURAL

## 2022-06-30 MED ORDER — ORAL CARE MOUTH RINSE: 15.0000 mL | Freq: Once | OROMUCOSAL | Status: AC

## 2022-06-30 MED ORDER — ACETAMINOPHEN 500 MG PO TABS
1000.0000 mg | ORAL_TABLET | Freq: Four times a day (QID) | ORAL | Status: DC
  Administered 2022-06-30 – 2022-07-01 (×3): 1000 mg via ORAL
  Filled 2022-06-30 (×4): qty 2

## 2022-06-30 MED ORDER — CEFAZOLIN SODIUM-DEXTROSE 2-4 GM/100ML-% IV SOLN
2.0000 g | INTRAVENOUS | Status: AC
  Administered 2022-06-30: 2 g via INTRAVENOUS
  Filled 2022-06-30: qty 100

## 2022-06-30 MED ORDER — ACETAMINOPHEN 10 MG/ML IV SOLN
INTRAVENOUS | Status: AC
  Filled 2022-06-30: qty 100

## 2022-06-30 MED ORDER — SODIUM CHLORIDE (PF) 0.9 % IJ SOLN
INTRAMUSCULAR | Status: AC
  Filled 2022-06-30: qty 30

## 2022-06-30 MED ORDER — EPHEDRINE SULFATE-NACL 50-0.9 MG/10ML-% IV SOSY
PREFILLED_SYRINGE | INTRAVENOUS | Status: DC | PRN
  Administered 2022-06-30 (×3): 5 mg via INTRAVENOUS

## 2022-06-30 MED ORDER — ONDANSETRON HCL 4 MG/2ML IJ SOLN
INTRAMUSCULAR | Status: AC
Start: 2022-06-30 — End: ?
  Filled 2022-06-30: qty 2

## 2022-06-30 MED ORDER — OXYCODONE HCL 5 MG PO TABS
5.0000 mg | ORAL_TABLET | ORAL | Status: DC | PRN
  Administered 2022-06-30 – 2022-07-01 (×2): 10 mg via ORAL

## 2022-06-30 MED ORDER — FENTANYL CITRATE (PF) 100 MCG/2ML IJ SOLN
INTRAMUSCULAR | Status: AC
  Filled 2022-06-30: qty 2

## 2022-06-30 MED ORDER — FERROUS SULFATE 325 (65 FE) MG PO TABS
325.0000 mg | ORAL_TABLET | Freq: Three times a day (TID) | ORAL | Status: DC
  Administered 2022-07-01 (×2): 325 mg via ORAL
  Filled 2022-06-30 (×2): qty 1

## 2022-06-30 MED ORDER — POVIDONE-IODINE 10 % EX SWAB
2.0000 | Freq: Once | CUTANEOUS | Status: AC
Start: 2022-06-30 — End: 2022-06-30
  Administered 2022-06-30: 2 via TOPICAL

## 2022-06-30 MED ORDER — ASPIRIN 81 MG PO CHEW
81.0000 mg | CHEWABLE_TABLET | Freq: Two times a day (BID) | ORAL | Status: DC
  Administered 2022-06-30 – 2022-07-01 (×2): 81 mg via ORAL
  Filled 2022-06-30 (×2): qty 1

## 2022-06-30 MED ORDER — EPHEDRINE 5 MG/ML INJ
INTRAVENOUS | Status: AC
  Filled 2022-06-30: qty 5

## 2022-06-30 MED ORDER — METHOCARBAMOL 1000 MG/10ML IJ SOLN: 500.0000 mg | Freq: Four times a day (QID) | INTRAVENOUS | Status: DC | PRN

## 2022-06-30 MED ORDER — ACETAMINOPHEN 500 MG PO TABS: 1000.0000 mg | ORAL_TABLET | Freq: Once | ORAL | Status: DC | PRN

## 2022-06-30 MED ORDER — PHENOL 1.4 % MT LIQD: 1.0000 | OROMUCOSAL | Status: DC | PRN

## 2022-06-30 MED ORDER — METOCLOPRAMIDE HCL 5 MG PO TABS: 5.0000 mg | ORAL_TABLET | Freq: Three times a day (TID) | ORAL | Status: DC | PRN

## 2022-06-30 MED ORDER — BUPIVACAINE-EPINEPHRINE (PF) 0.25% -1:200000 IJ SOLN
INTRAMUSCULAR | Status: DC | PRN
  Administered 2022-06-30: 30 mL

## 2022-06-30 MED ORDER — PROPOFOL 10 MG/ML IV BOLUS
INTRAVENOUS | Status: AC
  Filled 2022-06-30: qty 20

## 2022-06-30 MED ORDER — CEFAZOLIN SODIUM-DEXTROSE 2-4 GM/100ML-% IV SOLN
2.0000 g | Freq: Four times a day (QID) | INTRAVENOUS | Status: AC
  Administered 2022-06-30 (×2): 2 g via INTRAVENOUS
  Filled 2022-06-30 (×2): qty 100

## 2022-06-30 MED ORDER — KETOROLAC TROMETHAMINE 30 MG/ML IJ SOLN
INTRAMUSCULAR | Status: AC
  Filled 2022-06-30: qty 1

## 2022-06-30 MED ORDER — CHLORHEXIDINE GLUCONATE 0.12 % MT SOLN
15.0000 mL | Freq: Once | OROMUCOSAL | Status: AC
  Administered 2022-06-30: 15 mL via OROMUCOSAL

## 2022-06-30 MED ORDER — BUPIVACAINE-EPINEPHRINE (PF) 0.25% -1:200000 IJ SOLN
INTRAMUSCULAR | Status: AC
  Filled 2022-06-30: qty 30

## 2022-06-30 MED ORDER — TRANEXAMIC ACID-NACL 1000-0.7 MG/100ML-% IV SOLN
1000.0000 mg | INTRAVENOUS | Status: AC
  Administered 2022-06-30: 1000 mg via INTRAVENOUS
  Filled 2022-06-30: qty 100

## 2022-06-30 MED ORDER — BUPIVACAINE IN DEXTROSE 0.75-8.25 % IT SOLN
INTRATHECAL | Status: DC | PRN
  Administered 2022-06-30: 1.8 mL via INTRATHECAL

## 2022-06-30 MED ORDER — ACETAMINOPHEN 10 MG/ML IV SOLN
INTRAVENOUS | Status: DC | PRN
  Administered 2022-06-30: 1000 mg via INTRAVENOUS

## 2022-06-30 MED ORDER — DOCUSATE SODIUM 100 MG PO CAPS
100.0000 mg | ORAL_CAPSULE | Freq: Two times a day (BID) | ORAL | Status: DC
  Administered 2022-06-30 – 2022-07-01 (×2): 100 mg via ORAL
  Filled 2022-06-30 (×2): qty 1

## 2022-06-30 MED ORDER — ONDANSETRON HCL 4 MG/2ML IJ SOLN
INTRAMUSCULAR | Status: DC | PRN
  Administered 2022-06-30: 4 mg via INTRAVENOUS

## 2022-06-30 MED ORDER — OXYCODONE HCL 5 MG/5ML PO SOLN: 5.0000 mg | Freq: Once | ORAL | Status: DC | PRN

## 2022-06-30 MED ORDER — DIPHENHYDRAMINE HCL 12.5 MG/5ML PO ELIX: 12.5000 mg | ORAL_SOLUTION | ORAL | Status: DC | PRN

## 2022-06-30 MED ORDER — PROPOFOL 1000 MG/100ML IV EMUL
INTRAVENOUS | Status: AC
  Filled 2022-06-30: qty 100

## 2022-06-30 MED ORDER — ACETAMINOPHEN 325 MG PO TABS: 325.0000 mg | ORAL_TABLET | Freq: Four times a day (QID) | ORAL | Status: DC | PRN

## 2022-06-30 MED ORDER — TRANEXAMIC ACID-NACL 1000-0.7 MG/100ML-% IV SOLN
1000.0000 mg | Freq: Once | INTRAVENOUS | Status: AC
  Administered 2022-06-30: 1000 mg via INTRAVENOUS
  Filled 2022-06-30: qty 100

## 2022-06-30 MED ORDER — DEXAMETHASONE SODIUM PHOSPHATE 10 MG/ML IJ SOLN: 8.0000 mg | Freq: Once | INTRAMUSCULAR | Status: DC

## 2022-06-30 MED ORDER — 0.9 % SODIUM CHLORIDE (POUR BTL) OPTIME
TOPICAL | Status: DC | PRN
  Administered 2022-06-30: 1000 mL

## 2022-06-30 MED ORDER — ACETAMINOPHEN 160 MG/5ML PO SOLN: 1000.0000 mg | Freq: Once | ORAL | Status: DC | PRN

## 2022-06-30 MED ORDER — ONDANSETRON HCL 4 MG/2ML IJ SOLN: 4.0000 mg | Freq: Four times a day (QID) | INTRAMUSCULAR | Status: DC | PRN

## 2022-06-30 MED ORDER — MIDAZOLAM HCL 5 MG/5ML IJ SOLN
INTRAMUSCULAR | Status: DC | PRN
  Administered 2022-06-30 (×2): 1 mg via INTRAVENOUS

## 2022-06-30 MED ORDER — HYDROMORPHONE HCL 1 MG/ML IJ SOLN: 0.5000 mg | INTRAMUSCULAR | Status: DC | PRN

## 2022-06-30 MED ORDER — KETOROLAC TROMETHAMINE 30 MG/ML IJ SOLN
INTRAMUSCULAR | Status: DC | PRN
  Administered 2022-06-30: 30 mg

## 2022-06-30 MED ORDER — DEXAMETHASONE SODIUM PHOSPHATE 10 MG/ML IJ SOLN
INTRAMUSCULAR | Status: DC | PRN
  Administered 2022-06-30: 10 mg via INTRAVENOUS

## 2022-06-30 SURGICAL SUPPLY — 51 items
ATTUNE MED ANAT PAT 41 KNEE (Knees) IMPLANT
ATTUNE PS FEM LT SZ 7 CEM KNEE (Femur) IMPLANT
ATTUNE PSRP INSR SZ7 5 KNEE (Insert) IMPLANT
BAG COUNTER SPONGE SURGICOUNT (BAG) IMPLANT
BAG ZIPLOCK 12X15 (MISCELLANEOUS) IMPLANT
BASE TIBIAL ROT PLAT SZ 8 KNEE (Knees) IMPLANT
BLADE SAW SGTL 11.0X1.19X90.0M (BLADE) IMPLANT
BLADE SAW SGTL 13.0X1.19X90.0M (BLADE) ×1 IMPLANT
BNDG ELASTIC 6X5.8 VLCR STR LF (GAUZE/BANDAGES/DRESSINGS) ×1 IMPLANT
BOWL SMART MIX CTS (DISPOSABLE) ×1 IMPLANT
CEMENT HV SMART SET (Cement) IMPLANT
CUFF TOURN SGL QUICK 34 (TOURNIQUET CUFF) ×1
CUFF TRNQT CYL 34X4.125X (TOURNIQUET CUFF) ×1 IMPLANT
DERMABOND ADVANCED (GAUZE/BANDAGES/DRESSINGS) ×1
DERMABOND ADVANCED .7 DNX12 (GAUZE/BANDAGES/DRESSINGS) ×1 IMPLANT
DRAPE U-SHAPE 47X51 STRL (DRAPES) ×1 IMPLANT
DRESSING AQUACEL AG SP 3.5X10 (GAUZE/BANDAGES/DRESSINGS) ×1 IMPLANT
DRSG AQUACEL AG ADV 3.5X10 (GAUZE/BANDAGES/DRESSINGS) IMPLANT
DRSG AQUACEL AG SP 3.5X10 (GAUZE/BANDAGES/DRESSINGS) ×1
DURAPREP 26ML APPLICATOR (WOUND CARE) ×2 IMPLANT
ELECT REM PT RETURN 15FT ADLT (MISCELLANEOUS) ×1 IMPLANT
GLOVE BIO SURGEON STRL SZ 6 (GLOVE) ×1 IMPLANT
GLOVE BIOGEL PI IND STRL 6.5 (GLOVE) ×1 IMPLANT
GLOVE BIOGEL PI IND STRL 7.5 (GLOVE) ×1 IMPLANT
GLOVE ORTHO TXT STRL SZ7.5 (GLOVE) ×2 IMPLANT
GOWN STRL REUS W/ TWL LRG LVL3 (GOWN DISPOSABLE) ×2 IMPLANT
GOWN STRL REUS W/TWL LRG LVL3 (GOWN DISPOSABLE) ×2
HANDPIECE INTERPULSE COAX TIP (DISPOSABLE) ×1
HOLDER FOLEY CATH W/STRAP (MISCELLANEOUS) IMPLANT
KIT TURNOVER KIT A (KITS) IMPLANT
MANIFOLD NEPTUNE II (INSTRUMENTS) ×1 IMPLANT
NDL SAFETY ECLIP 18X1.5 (MISCELLANEOUS) IMPLANT
NS IRRIG 1000ML POUR BTL (IV SOLUTION) ×1 IMPLANT
PACK TOTAL KNEE CUSTOM (KITS) ×1 IMPLANT
PIN FIX SIGMA LCS THRD HI (PIN) IMPLANT
PROTECTOR NERVE ULNAR (MISCELLANEOUS) ×1 IMPLANT
SET HNDPC FAN SPRY TIP SCT (DISPOSABLE) ×1 IMPLANT
SET PAD KNEE POSITIONER (MISCELLANEOUS) ×1 IMPLANT
SPIKE FLUID TRANSFER (MISCELLANEOUS) ×2 IMPLANT
SUT MNCRL AB 4-0 PS2 18 (SUTURE) ×1 IMPLANT
SUT STRATAFIX PDS+ 0 24IN (SUTURE) ×1 IMPLANT
SUT VIC AB 1 CT1 36 (SUTURE) ×1 IMPLANT
SUT VIC AB 2-0 CT1 27 (SUTURE) ×4
SUT VIC AB 2-0 CT1 TAPERPNT 27 (SUTURE) ×2 IMPLANT
SYR 3ML LL SCALE MARK (SYRINGE) ×1 IMPLANT
TIBIAL BASE ROT PLAT SZ 8 KNEE (Knees) ×1 IMPLANT
TOWEL GREEN STERILE FF (TOWEL DISPOSABLE) ×1 IMPLANT
TRAY FOLEY MTR SLVR 16FR STAT (SET/KITS/TRAYS/PACK) ×1 IMPLANT
TUBE SUCTION HIGH CAP CLEAR NV (SUCTIONS) ×1 IMPLANT
WATER STERILE IRR 1000ML POUR (IV SOLUTION) ×2 IMPLANT
WRAP KNEE MAXI GEL POST OP (GAUZE/BANDAGES/DRESSINGS) ×1 IMPLANT

## 2022-06-30 NOTE — Anesthesia Procedure Notes (Addendum)
Spinal  Patient location during procedure: OR Start time: 06/30/2022 7:15 AM End time: 06/30/2022 7:23 AM Reason for block: surgical anesthesia Staffing Anesthesiologist: Oleta Mouse, MD Performed by: Oleta Mouse, MD Authorized by: Oleta Mouse, MD   Preanesthetic Checklist Completed: patient identified, IV checked, risks and benefits discussed, surgical consent, monitors and equipment checked, pre-op evaluation and timeout performed Spinal Block Patient position: sitting Prep: DuraPrep Patient monitoring: heart rate, cardiac monitor, continuous pulse ox and blood pressure Approach: midline Location: L3-4 Injection technique: single-shot Needle Needle type: Sprotte  Needle gauge: 24 G Needle length: 9 cm Assessment Sensory level: T4 Events: CSF return and second provider

## 2022-06-30 NOTE — Op Note (Signed)
NAME:  Chase Knapp                      MEDICAL RECORD NO.:  [id number]                             FACILITY:  [facility]      PHYSICIAN:  Pietro Cassis. Alvan Dame, M.D.  DATE OF BIRTH:  04-06-1946      DATE OF PROCEDURE:  06/30/2022                                     OPERATIVE REPORT         PREOPERATIVE DIAGNOSIS:  Left knee osteoarthritis.      POSTOPERATIVE DIAGNOSIS:  Left knee osteoarthritis.      FINDINGS:  The patient was noted to have complete loss of cartilage and   bone-on-bone arthritis with associated osteophytes in the medial and patellofemoral compartments of   the knee with a significant synovitis and associated effusion.  The patient had failed months of conservative treatment including medications, injection therapy, activity modification.     PROCEDURE:  Left total knee replacement.      COMPONENTS USED:  DePuy Attune rotating platform posterior stabilized knee   system, a size 7 femur, 8 tibia, size 5 mm PS AOX insert, and 41 anatomic patellar   button.      SURGEON:  Pietro Cassis. Alvan Dame, M.D.      ASSISTANT:  Costella Hatcher, PA-C.      ANESTHESIA:  Regional and Spinal.      SPECIMENS:  None.      COMPLICATION:  None.      DRAINS:  None.  EBL: <100 cc      TOURNIQUET TIME:   Total Tourniquet Time Documented: Thigh (Left) - 37 minutes Total: Thigh (Left) - 37 minutes  .      The patient was stable to the recovery room.      INDICATION FOR PROCEDURE:  [name] is a 76 y.o. male patient of   mine.  The patient had been seen, evaluated, and treated for months conservatively in the   office with medication, activity modification, and injections.  The patient had   radiographic changes of bone-on-bone arthritis with endplate sclerosis and osteophytes noted.  Based on the radiographic changes and failed conservative measures, the patient   decided to proceed with definitive treatment, total knee replacement.  Risks of infection, DVT, component failure, need for  revision surgery, neurovascular injury were reviewed in the office setting.  The postop course was reviewed stressing the efforts to maximize post-operative satisfaction and function.  Consent was obtained for benefit of pain   relief.      PROCEDURE IN DETAIL:  The patient was brought to the operative theater.   Once adequate anesthesia, preoperative antibiotics, 2 gm of Ancef,1 gm of Tranexamic Acid, and 10 mg of Decadron administered, the patient was positioned supine with a left thigh tourniquet placed.  The  left lower extremity was prepped and draped in sterile fashion.  A time-   out was performed identifying the patient, planned procedure, and the appropriate extremity.      The left lower extremity was placed in the [facility] leg holder.  The leg was   exsanguinated, tourniquet elevated to 250 mmHg.  A midline incision was   made followed  by median parapatellar arthrotomy.  Following initial   exposure, attention was first directed to the patella.  Precut   measurement was noted to be 26 mm.  I resected down to 14 mm and used a   41 anatomic patellar button to restore patellar height as well as cover the cut surface.      The lug holes were drilled and a metal shim was placed to protect the   patella from retractors and saw blade during the procedure.      At this point, attention was now directed to the femur.  The femoral   canal was opened with a drill, irrigated to try to prevent fat emboli.  An   intramedullary rod was passed at 5 degrees valgus, 10 mm of bone was   resected off the distal femur.  Following this resection, the tibia was   subluxated anteriorly.  Using the extramedullary guide, 2 mm of bone was resected off   the proximal medial tibia.  We confirmed the gap would be   stable medially and laterally with a size 5 spacer block as well as confirmed that the tibial cut was perpendicular in the coronal plane, checking with an alignment rod.      Once this was done, I  sized the femur to be a size 7 in the anterior-   posterior dimension, chose a standard component based on medial and   lateral dimension.  The size 7 rotation block was then pinned in   position anterior referenced using the C-clamp to set rotation.  The   anterior, posterior, and  chamfer cuts were made without difficulty nor   notching making certain that I was along the anterior cortex to help   with flexion gap stability.      The final box cut was made off the lateral aspect of distal femur.      At this point, the tibia was sized to be a size 8.  The size 8 tray was   then pinned in position through the medial third of the tubercle,   drilled, and keel punched.  Trial reduction was now carried with a 7 femur,  8 tibia, a size 5 mm PS insert, and the 41 anatomic patella botton.  The knee was brought to full extension with good flexion stability with the patella   tracking through the trochlea without application of pressure.  Given   all these findings the trial components removed.  Final components were   opened and cement was mixed.  The knee was irrigated with normal saline solution and pulse lavage.  The synovial lining was   then injected with 30 cc of 0.25% Marcaine with epinephrine, 1 cc of Toradol and 30 cc of NS for a total of 61 cc.     Final implants were then cemented onto cleaned and dried cut surfaces of bone with the knee brought to extension with a size 5 mm PS trial insert.      Once the cement had fully cured, excess cement was removed   throughout the knee.  I confirmed that I was satisfied with the range of   motion and stability, and the final size 5 mm PS AOX insert was chosen.  It was   placed into the knee.      The tourniquet had been let down at 37 minutes.  No significant   hemostasis was required.  The extensor mechanism was then reapproximated using #1 Vicryl and #1  Stratafix sutures with the knee   in flexion.  The   remaining wound was closed with 2-0  Vicryl and running 4-0 Monocryl.   The knee was cleaned, dried, dressed sterilely using Dermabond and   Aquacel dressing.  The patient was then   brought to recovery room in stable condition, tolerating the procedure   well.   Please note that Physician Assistant, Costella Hatcher, PA-C was present for the entirety of the case, and was utilized for pre-operative positioning, peri-operative retractor management, general facilitation of the procedure and for primary wound closure at the end of the case.              Pietro Cassis Alvan Dame, M.D.    06/30/2022 8:48 AM

## 2022-06-30 NOTE — Anesthesia Postprocedure Evaluation (Signed)
Anesthesia Post Note  Patient: Chase Knapp  Procedure(s) Performed: TOTAL KNEE ARTHROPLASTY (Left: Knee)     Patient location during evaluation: PACU Anesthesia Type: Regional, Spinal and MAC Level of consciousness: awake and alert Pain management: pain level controlled Vital Signs Assessment: post-procedure vital signs reviewed and stable Respiratory status: spontaneous breathing, nonlabored ventilation and respiratory function stable Cardiovascular status: stable and blood pressure returned to baseline Postop Assessment: no apparent nausea or vomiting Anesthetic complications: no   No notable events documented.  Last Vitals:  Vitals:   06/30/22 1015 06/30/22 1353  BP:  138/76  Pulse:  (!) 57  Resp:  18  Temp:  (!) 36.4 C  SpO2: 100% 98%    Last Pain:  Vitals:   06/30/22 1353  TempSrc: Oral  PainSc:                  Chase Knapp

## 2022-06-30 NOTE — Interval H&P Note (Signed)
History and Physical Interval Note:  06/30/2022 6:39 AM  Chase Knapp  has presented today for surgery, with the diagnosis of Left knee osteoarthritis.  The various methods of treatment have been discussed with the patient and family. After consideration of risks, benefits and other options for treatment, the patient has consented to  Procedure(s): TOTAL KNEE ARTHROPLASTY (Left) as a surgical intervention.  The patient's history has been reviewed, patient examined, no change in status, stable for surgery.  I have reviewed the patient's chart and labs.  Questions were answered to the patient's satisfaction.     [name]

## 2022-06-30 NOTE — Discharge Instructions (Signed)

## 2022-06-30 NOTE — Plan of Care (Signed)
Plan of care reviewed and discussed with the patient. 

## 2022-06-30 NOTE — Anesthesia Procedure Notes (Signed)
Anesthesia Regional Block: Adductor canal block   Pre-Anesthetic Checklist: , timeout performed,  Correct Patient, Correct Site, Correct Laterality,  Correct Procedure, Correct Position, site marked,  Risks and benefits discussed,  Surgical consent,  Pre-op evaluation,  At surgeon's request and post-op pain management  Laterality: Left and Lower  Prep: chloraprep       Needles:  Injection technique: Single-shot      Needle Length: 9cm  Needle Gauge: 22     Additional Needles: Arrow StimuQuik ECHO Echogenic Stimulating PNB Needle  Procedures:,,,, ultrasound used (permanent image in chart),,    Narrative:  Start time: 06/30/2022 7:03 AM End time: 06/30/2022 7:09 AM Injection made incrementally with aspirations every 5 mL.  Performed by: Personally  Anesthesiologist: Oleta Mouse, MD

## 2022-06-30 NOTE — Anesthesia Preprocedure Evaluation (Signed)
Anesthesia Evaluation  Patient identified by MRN, date of birth, ID band Patient awake    Reviewed: Allergy & Precautions, NPO status , Patient's Chart, lab work & pertinent test results  Airway Mallampati: II  TM Distance: >3 FB Neck ROM: Full    Dental  (+) Partial Lower, Partial Upper, Dental Advisory Given   Pulmonary neg pulmonary ROS,    breath sounds clear to auscultation       Cardiovascular (-) hypertension(-) angina(-) CHF + dysrhythmias Atrial Fibrillation  Rhythm:Regular     Neuro/Psych negative neurological ROS  negative psych ROS   GI/Hepatic negative GI ROS, Neg liver ROS,   Endo/Other  negative endocrine ROS  Renal/GU negative Renal ROS     Musculoskeletal  (+) Arthritis ,   Abdominal   Peds  Hematology negative hematology ROS (+)   Anesthesia Other Findings   Reproductive/Obstetrics                             Anesthesia Physical Anesthesia Plan  ASA: 2  Anesthesia Plan: MAC, Regional and Spinal   Post-op Pain Management: Regional block* and Ofirmev IV (intra-op)*   Induction: Intravenous  PONV Risk Score and Plan: 1 and Propofol infusion and Treatment may vary due to age or medical condition  Airway Management Planned: Nasal Cannula and Natural Airway  Additional Equipment: None  Intra-op Plan:   Post-operative Plan: Extubation in OR  Informed Consent: I have reviewed the patients History and Physical, chart, labs and discussed the procedure including the risks, benefits and alternatives for the proposed anesthesia with the patient or authorized representative who has indicated his/her understanding and acceptance.     Dental advisory given  Plan Discussed with: CRNA  Anesthesia Plan Comments:         Anesthesia Quick Evaluation

## 2022-06-30 NOTE — Care Plan (Signed)
Ortho Bundle Case Management Note  Patient Details  Name: Chase Knapp MRN: [id number] Date of Birth: 28-May-1946  L TKA on 06-30-22 DCP:  Home with wife DME:  No needs, has a RW PT:  [facility] on 07-04-22                   DME Arranged:  N/A DME Agency:  NA  HH Arranged:  NA HH Agency:  NA  Additional Comments: Please contact me with any questions of if this plan should need to change.  Marianne Sofia, RN,CCM [facility]  [phone] 06/30/2022, 9:09 AM

## 2022-06-30 NOTE — Transfer of Care (Signed)
Immediate Anesthesia Transfer of Care Note  Patient: Million Maharaj  Procedure(s) Performed: TOTAL KNEE ARTHROPLASTY (Left: Knee)  Patient Location: PACU  Anesthesia Type:Spinal  Level of Consciousness: awake  Airway & Oxygen Therapy: Patient Spontanous Breathing and Patient connected to face mask oxygen  Post-op Assessment: Report given to RN and Post -op Vital signs reviewed and stable  Post vital signs: Reviewed and stable  Last Vitals:  Vitals Value Taken Time  BP 126/72 06/30/22 0915  Temp    Pulse 66 06/30/22 0915  Resp 17 06/30/22 0915  SpO2 98 % 06/30/22 0915  Vitals shown include unvalidated device data.  Last Pain:  Vitals:   06/30/22 0552  TempSrc:   PainSc: 0-No pain      Patients Stated Pain Goal: 5 (19/14/78 2956)  Complications: No notable events documented.

## 2022-06-30 NOTE — Evaluation (Signed)
Physical Therapy Evaluation Patient Details Name: Chase Knapp MRN: [id number] DOB: 1946-06-04 Today's Date: 06/30/2022  History of Present Illness  Pt is a 76yo male presenting s/p L-TKA on 06/30/22. PMH: ablation for afib, R-THA 2022.  Clinical Impression  Chase Knapp is a 76 y.o. male POD 0 s/p L-TKA. Patient reports independence with mobility at baseline. Patient is now limited by functional impairments (see PT problem list below) and requires supervision for bed mobility and min guard for transfers; pt demonstrated mild L knee buckling so ambulation deferred. Patient instructed in exercise to facilitate ROM and circulation to manage edema. Provided incentive spirometer and with Vcs pt able to achieve 1244m. Patient will benefit from continued skilled PT interventions to address impairments and progress towards PLOF. Acute PT will follow to progress mobility and stair training in preparation for safe discharge home.       Recommendations for follow up therapy are one component of a multi-disciplinary discharge planning process, led by the attending physician.  Recommendations may be updated based on patient status, additional functional criteria and insurance authorization.  Follow Up Recommendations Follow physician's recommendations for discharge plan and follow up therapies      Assistance Recommended at Discharge Set up Supervision/Assistance  Patient can return home with the following  A little help with walking and/or transfers;A little help with bathing/dressing/bathroom;Assistance with cooking/housework;Assist for transportation;Help with stairs or ramp for entrance    Equipment Recommendations None recommended by PT  Recommendations for Other Services       Functional Status Assessment Patient has had a recent decline in their functional status and demonstrates the ability to make significant improvements in function in a reasonable and predictable amount of time.      Precautions / Restrictions Precautions Precautions: Knee Precaution Booklet Issued: No Precaution Comments: no pillow under the knee Restrictions Weight Bearing Restrictions: No Other Position/Activity Restrictions: WBAT      Mobility  Bed Mobility Overal bed mobility: Needs Assistance Bed Mobility: Supine to Sit     Supine to sit: Supervision     General bed mobility comments: Supervision for safety only    Transfers Overall transfer level: Needs assistance Equipment used: Rolling walker (2 wheels) Transfers: Bed to chair/wheelchair/BSC, Sit to/from Stand Sit to Stand: Min guard, From elevated surface   Step pivot transfers: Min guard, From elevated surface, +2 safety/equipment       General transfer comment: Pt required min guard for safety only, VCs for using BUE to powerup off bed. In standing, pt demonstrated mild L knee buckling and demonstrated reduced motor control at the hip, directed pt to complete step pivot transfer with PT providing min guard at the L knee to prevent buckling; pt able to maintain appropriate degree of extension without assistance +2 for safety. Further mobility deferred    Ambulation/Gait               General Gait Details: deferred  Stairs            Wheelchair Mobility    Modified Rankin (Stroke Patients Only)       Balance Overall balance assessment: Needs assistance Sitting-balance support: Feet supported, No upper extremity supported Sitting balance-Leahy Scale: Good     Standing balance support: Reliant on assistive device for balance, During functional activity, Bilateral upper extremity supported Standing balance-Leahy Scale: Poor  Pertinent Vitals/Pain Pain Assessment Pain Assessment: No/denies pain    Home Living Family/patient expects to be discharged to:: Private residence Living Arrangements: Spouse/significant other Available Help at Discharge:  Family;Available 24 hours/day Type of Home: House Home Access: Stairs to enter;Ramped entrance (Ramp in the back) Entrance Stairs-Rails: Right;Left Entrance Stairs-Number of Steps: 5   Home Layout: Two level;Able to live on main level with bedroom/bathroom Home Equipment: Grab bars - tub/shower;Shower Land (2 wheels);Cane - single point;Adaptive equipment      Prior Function Prior Level of Function : Independent/Modified Independent;Driving             Mobility Comments: IND ADLs Comments: IND     Hand Dominance        Extremity/Trunk Assessment   Upper Extremity Assessment Upper Extremity Assessment: Overall WFL for tasks assessed    Lower Extremity Assessment Lower Extremity Assessment: RLE deficits/detail;LLE deficits/detail RLE Deficits / Details: MMT ank DF/PF 5/5 RLE Sensation: WNL LLE Deficits / Details: MMT ank DF/PF 5/5, no extensor lag ntoed LLE Sensation: WNL    Cervical / Trunk Assessment Cervical / Trunk Assessment: Normal  Communication   Communication: HOH  Cognition Arousal/Alertness: Awake/alert Behavior During Therapy: WFL for tasks assessed/performed Overall Cognitive Status: Within Functional Limits for tasks assessed                                          General Comments      Exercises Total Joint Exercises Ankle Circles/Pumps: AROM, Both, 20 reps   Assessment/Plan    PT Assessment Patient needs continued PT services  PT Problem List Decreased strength;Decreased range of motion;Decreased activity tolerance;Decreased balance;Decreased mobility;Decreased coordination;Pain       PT Treatment Interventions DME instruction;Gait training;Stair training;Functional mobility training;Therapeutic activities;Therapeutic exercise;Balance training;Neuromuscular re-education;Patient/family education    PT Goals (Current goals can be found in the Care Plan section)  Acute Rehab PT Goals Patient Stated Goal:  Reduce pain, increase flexibility PT Goal Formulation: With patient Time For Goal Achievement: 07/07/22 Potential to Achieve Goals: Good    Frequency 7X/week     Co-evaluation               AM-PAC PT "6 Clicks" Mobility  Outcome Measure Help needed turning from your back to your side while in a flat bed without using bedrails?: None Help needed moving from lying on your back to sitting on the side of a flat bed without using bedrails?: A Little Help needed moving to and from a bed to a chair (including a wheelchair)?: A Little Help needed standing up from a chair using your arms (e.g., wheelchair or bedside chair)?: A Little Help needed to walk in hospital room?: A Little Help needed climbing 3-5 steps with a railing? : A Little 6 Click Score: 19    End of Session Equipment Utilized During Treatment: Gait belt Activity Tolerance: Patient tolerated treatment well;No increased pain Patient left: in chair;with call bell/phone within reach;with chair alarm set;with family/visitor present Nurse Communication: Mobility status PT Visit Diagnosis: Difficulty in walking, not elsewhere classified (R26.2);Pain Pain - Right/Left: Left Pain - part of body: Knee    Time: 7371-0626 PT Time Calculation (min) (ACUTE ONLY): 18 min   Charges:   PT Evaluation $PT Eval Low Complexity: 1 Low          Coolidge Breeze, PT, DPT [facility] Rehabilitation Department Office: [phone] Pager: [phone]  Gwyndolyn Saxon  [facility] [employer] 06/30/2022, 2:49 PM

## 2022-07-01 ENCOUNTER — Encounter (HOSPITAL_COMMUNITY): Payer: Self-pay | Admitting: Orthopedic Surgery

## 2022-07-01 ENCOUNTER — Other Ambulatory Visit: Payer: Self-pay

## 2022-07-01 DIAGNOSIS — I4891 Unspecified atrial fibrillation: Secondary | ICD-10-CM | POA: Diagnosis not present

## 2022-07-01 DIAGNOSIS — M1712 Unilateral primary osteoarthritis, left knee: Secondary | ICD-10-CM | POA: Diagnosis not present

## 2022-07-01 DIAGNOSIS — Z96641 Presence of right artificial hip joint: Secondary | ICD-10-CM | POA: Diagnosis not present

## 2022-07-01 DIAGNOSIS — Z79899 Other long term (current) drug therapy: Secondary | ICD-10-CM | POA: Diagnosis not present

## 2022-07-01 DIAGNOSIS — Z85828 Personal history of other malignant neoplasm of skin: Secondary | ICD-10-CM | POA: Diagnosis not present

## 2022-07-01 LAB — BASIC METABOLIC PANEL
Anion gap: 4 — ABNORMAL LOW (ref 5–15)
BUN: 18 mg/dL (ref 8–23)
CO2: 25 mmol/L (ref 22–32)
Calcium: 8.3 mg/dL — ABNORMAL LOW (ref 8.9–10.3)
Chloride: 107 mmol/L (ref 98–111)
Creatinine, Ser: 0.82 mg/dL (ref 0.61–1.24)
GFR, Estimated: >60 mL/min (ref >60)
Glucose, Bld: 133 mg/dL — ABNORMAL HIGH (ref 70–99)
Potassium: 4.4 mmol/L (ref 3.5–5.1)
Sodium: 136 mmol/L (ref 135–145)

## 2022-07-01 LAB — CBC
HCT: 34 % — ABNORMAL LOW (ref 39.0–52.0)
Hemoglobin: 11.7 g/dL — ABNORMAL LOW (ref 13.0–17.0)
MCH: 32 pg (ref 26.0–34.0)
MCHC: 34.4 g/dL (ref 30.0–36.0)
MCV: 92.9 fL (ref 80.0–100.0)
Platelets: 153 10*3/uL (ref 150–400)
RBC: 3.66 MIL/uL — ABNORMAL LOW (ref 4.22–5.81)
RDW: 12 % (ref 11.5–15.5)
WBC: 13.2 10*3/uL — ABNORMAL HIGH (ref 4.0–10.5)
nRBC: 0 % (ref 0.0–0.2)

## 2022-07-01 MED ORDER — ACETAMINOPHEN 500 MG PO TABS: 1000.0000 mg | ORAL_TABLET | Freq: Four times a day (QID) | ORAL | 0 refills | Status: AC

## 2022-07-01 MED ORDER — DOCUSATE SODIUM 100 MG PO CAPS: 100.0000 mg | ORAL_CAPSULE | Freq: Two times a day (BID) | ORAL | 0 refills | Status: DC

## 2022-07-01 MED ORDER — POLYETHYLENE GLYCOL 3350 17 G PO PACK: 17.0000 g | PACK | Freq: Every day | ORAL | 0 refills | Status: DC | PRN

## 2022-07-01 MED ORDER — ASPIRIN 81 MG PO CHEW: 81.0000 mg | CHEWABLE_TABLET | Freq: Two times a day (BID) | ORAL | 0 refills | Status: AC

## 2022-07-01 MED ORDER — OXYCODONE HCL 5 MG PO TABS: 5.0000 mg | ORAL_TABLET | ORAL | 0 refills | Status: DC | PRN

## 2022-07-01 MED ORDER — METHOCARBAMOL 500 MG PO TABS: 500.0000 mg | ORAL_TABLET | Freq: Four times a day (QID) | ORAL | 2 refills | Status: DC | PRN

## 2022-07-01 NOTE — Progress Notes (Signed)
Physical Therapy Treatment Patient Details Name: Chase Knapp MRN: [id number] DOB: [birth year] Today's Date: 07/01/2022   History of Present Illness Pt is a 76yo male presenting s/p L-TKA on 06/30/22. PMH: ablation for afib, R-THA 2022.    PT Comments    POD # 1 am session.  General Comments: AxO x 3 very pleasant/motivated.  Recent THR and planning to get "other" knee done. Assisted OOB to amb to bathroom then in hallway.  Practiced stairs with Spouse present.  Then returned to room to perform some TE's following HEP handout.  Instructed on proper tech, freq as well as use of ICE.  Addressed all mobility questions, discussed appropriate activity, educated on use of ICE.  Pt ready for D/C to home.   Recommendations for follow up therapy are one component of a multi-disciplinary discharge planning process, led by the attending physician.  Recommendations may be updated based on patient status, additional functional criteria and insurance authorization.  Follow Up Recommendations  Outpatient PT     Assistance Recommended at Discharge Set up Supervision/Assistance  Patient can return home with the following A little help with walking and/or transfers;A little help with bathing/dressing/bathroom;Assistance with cooking/housework;Assist for transportation;Help with stairs or ramp for entrance   Equipment Recommendations  None recommended by PT    Recommendations for Other Services       Precautions / Restrictions Precautions Precautions: Knee Precaution Comments: no pillow under the knee Restrictions Weight Bearing Restrictions: No Other Position/Activity Restrictions: WBAT     Mobility  Bed Mobility Overal bed mobility: Needs Assistance Bed Mobility: Supine to Sit     Supine to sit: Supervision     General bed mobility comments: Supervision for safety only with increased time.  Using belt to assist L LE.    Transfers Overall transfer level: Needs assistance Equipment used:  Rolling walker (2 wheels) Transfers: Sit to/from Stand Sit to Stand: Supervision, Min guard           General transfer comment: one Vc on proper hand placement and increased time.  Self able.  Also assisted with a toilet transfer.    Ambulation/Gait Ambulation/Gait assistance: Supervision Gait Distance (Feet): 65 Feet Assistive device: Rolling walker (2 wheels) Gait Pattern/deviations: Step-through pattern       General Gait Details: 25% VC's to decrease gait speed to increase balance stability.  Tolerated amb a functional distance 65 feet with walker.   Stairs Stairs: Yes Stairs assistance: Supervision, Min guard Stair Management: Two rails, Forwards, With walker Number of Stairs: 2 General stair comments: 25% VC's on proper sequencing as well as safety.   Wheelchair Mobility    Modified Rankin (Stroke Patients Only)       Balance                                            Cognition Arousal/Alertness: Awake/alert Behavior During Therapy: WFL for tasks assessed/performed Overall Cognitive Status: Within Functional Limits for tasks assessed                                 General Comments: AxO x 3 very pleasant/motivated.  Recent THR and planning to get "other" knee done.        Exercises   Total Knee Replacement TE's following HEP handout 10 reps B LE ankle pumps 05 reps towel  squeezes 05 reps knee presses 05 reps heel slides  05 reps SAQ's 05 reps SLR's 05 reps ABD Educated on use of gait belt to assist with TE's Followed by ICE    General Comments        Pertinent Vitals/Pain Pain Assessment Pain Assessment: 0-10 Pain Score: 3  Pain Location: L knee Pain Descriptors / Indicators: Grimacing, Tender, Tightness Pain Intervention(s): Monitored during session, Premedicated before session, Repositioned, Ice applied    Home Living                          Prior Function            PT Goals  (current goals can now be found in the care plan section) Progress towards PT goals: Progressing toward goals    Frequency    7X/week      PT Plan Current plan remains appropriate    Co-evaluation              AM-PAC PT "6 Clicks" Mobility   Outcome Measure  Help needed turning from your back to your side while in a flat bed without using bedrails?: None Help needed moving from lying on your back to sitting on the side of a flat bed without using bedrails?: None Help needed moving to and from a bed to a chair (including a wheelchair)?: A Little Help needed standing up from a chair using your arms (e.g., wheelchair or bedside chair)?: A Little Help needed to walk in hospital room?: A Little Help needed climbing 3-5 steps with a railing? : A Little 6 Click Score: 20    End of Session Equipment Utilized During Treatment: Gait belt Activity Tolerance: Patient tolerated treatment well Patient left: in chair;with call bell/phone within reach;with chair alarm set;with family/visitor present Nurse Communication: Mobility status (pt ready for D/C to home after ONE session) PT Visit Diagnosis: Difficulty in walking, not elsewhere classified (R26.2);Pain Pain - Right/Left: Left Pain - part of body: Knee     Time: 1110-1150 PT Time Calculation (min) (ACUTE ONLY): 40 min  Charges:  $Gait Training: 8-22 mins $Therapeutic Exercise: 8-22 mins $Therapeutic Activity: 8-22 mins                     Rica Koyanagi  PTA Acute  Rehabilitation Services Office M-F          [phone] Weekend pager [phone]

## 2022-07-01 NOTE — Progress Notes (Signed)
   Subjective: 1 Day Post-Op Procedure(s) (LRB): TOTAL KNEE ARTHROPLASTY (Left) Patient reports pain as mild.   Patient seen in rounds with Dr. Alvan Dame. Patient is well, and has had no acute complaints or problems. No acute events overnight. Foley catheter removed. Patient ambulated a few feet with PT, but was limited as his block was still in effect.  We will start therapy today.   Objective: Vital signs in last 24 hours: Temp:  [97.5 F (36.4 C)-98.6 F (37 C)] 97.6 F (36.4 C) (09/08 0512) Pulse Rate:  [52-66] 52 (09/08 0512) Resp:  [12-18] 18 (09/08 0512) BP: (110-138)/(65-77) 118/66 (09/08 0512) SpO2:  [93 %-100 %] 100 % (09/08 0512)  Intake/Output from previous day:  Intake/Output Summary (Last 24 hours) at 07/01/2022 0734 Last data filed at 07/01/2022 0600 Gross per 24 hour  Intake 2986.29 ml  Output 3400 ml  Net -413.71 ml     Intake/Output this shift: No intake/output data recorded.  Labs: Recent Labs    07/01/22 0402  HGB 11.7*   Recent Labs    07/01/22 0402  WBC 13.2*  RBC 3.66*  HCT 34.0*  PLT 153   Recent Labs    06/30/22 0557 07/01/22 0402  NA 137 136  K 3.7 4.4  CL 108 107  CO2 22 25  BUN 19 18  CREATININE 0.89 0.82  GLUCOSE 117* 133*  CALCIUM 8.6* 8.3*   No results for input(s): "LABPT", "INR" in the last 72 hours.  Exam: General - Patient is Alert and Oriented Extremity - Neurologically intact Sensation intact distally Intact pulses distally Dorsiflexion/Plantar flexion intact Dressing - dressing C/D/I Motor Function - intact, moving foot and toes well on exam.   Past Medical History:  Diagnosis Date   Cancer ([place]) 2006   skin on Lt upper arm   Dysrhythmia 2020   A-Fib   History of cardioversion    Hypertriglyceridemia    Medical history non-contributory    MRSA infection    Right knee prolonged IV antibiotics   Osteoarthritis    Varicose veins of both lower extremities     Assessment/Plan: 1 Day Post-Op Procedure(s)  (LRB): TOTAL KNEE ARTHROPLASTY (Left) Principal Problem:   S/P total knee arthroplasty, left  Estimated body mass index is 30.71 kg/m as calculated from the following:   Height as of this encounter: 5' 7.5" (1.715 m).   Weight as of this encounter: 90.3 kg. Advance diet Up with therapy D/C IV fluids   Patient's anticipated LOS is less than 2 midnights, meeting these requirements: - Younger than 42 - Lives within 1 hour of care - Has a competent adult at home to recover with post-op recover - NO history of  - Chronic pain requiring opiods  - Diabetes  - Coronary Artery Disease  - Heart failure  - Heart attack  - Stroke  - DVT/VTE  - Cardiac arrhythmia  - Respiratory Failure/COPD  - Renal failure  - Anemia  - Advanced Liver disease     DVT Prophylaxis - Aspirin Weight bearing as tolerated.  Hgb stable at 11.7 this AM.   Plan is to go Home after hospital stay. Plan for discharge today following 1-2 sessions of PT as long as they are meeting their goals. Patient is scheduled for OPPT. Follow up in the office in 2 weeks.   Griffith Citron, PA-C Orthopedic Surgery [phone] 07/01/2022, 7:34 AM

## 2022-07-01 NOTE — Progress Notes (Addendum)
Pt has voided.  '@1247'$  - AVS reviewed with pt. All questions answered.

## 2022-07-04 DIAGNOSIS — R2689 Other abnormalities of gait and mobility: Secondary | ICD-10-CM | POA: Diagnosis not present

## 2022-07-04 DIAGNOSIS — M62552 Muscle wasting and atrophy, not elsewhere classified, left thigh: Secondary | ICD-10-CM | POA: Diagnosis not present

## 2022-07-04 DIAGNOSIS — M25562 Pain in left knee: Secondary | ICD-10-CM | POA: Diagnosis not present

## 2022-07-04 DIAGNOSIS — Z96652 Presence of left artificial knee joint: Secondary | ICD-10-CM | POA: Diagnosis not present

## 2022-07-06 DIAGNOSIS — R2689 Other abnormalities of gait and mobility: Secondary | ICD-10-CM | POA: Diagnosis not present

## 2022-07-06 DIAGNOSIS — M25562 Pain in left knee: Secondary | ICD-10-CM | POA: Diagnosis not present

## 2022-07-06 DIAGNOSIS — M62552 Muscle wasting and atrophy, not elsewhere classified, left thigh: Secondary | ICD-10-CM | POA: Diagnosis not present

## 2022-07-06 DIAGNOSIS — Z96652 Presence of left artificial knee joint: Secondary | ICD-10-CM | POA: Diagnosis not present

## 2022-07-08 DIAGNOSIS — M62552 Muscle wasting and atrophy, not elsewhere classified, left thigh: Secondary | ICD-10-CM | POA: Diagnosis not present

## 2022-07-08 DIAGNOSIS — M25562 Pain in left knee: Secondary | ICD-10-CM | POA: Diagnosis not present

## 2022-07-08 DIAGNOSIS — Z96652 Presence of left artificial knee joint: Secondary | ICD-10-CM | POA: Diagnosis not present

## 2022-07-08 DIAGNOSIS — R2689 Other abnormalities of gait and mobility: Secondary | ICD-10-CM | POA: Diagnosis not present

## 2022-07-11 DIAGNOSIS — R2689 Other abnormalities of gait and mobility: Secondary | ICD-10-CM | POA: Diagnosis not present

## 2022-07-11 DIAGNOSIS — M25562 Pain in left knee: Secondary | ICD-10-CM | POA: Diagnosis not present

## 2022-07-11 DIAGNOSIS — Z96652 Presence of left artificial knee joint: Secondary | ICD-10-CM | POA: Diagnosis not present

## 2022-07-11 DIAGNOSIS — M62552 Muscle wasting and atrophy, not elsewhere classified, left thigh: Secondary | ICD-10-CM | POA: Diagnosis not present

## 2022-07-12 DIAGNOSIS — M62552 Muscle wasting and atrophy, not elsewhere classified, left thigh: Secondary | ICD-10-CM | POA: Diagnosis not present

## 2022-07-12 DIAGNOSIS — R2689 Other abnormalities of gait and mobility: Secondary | ICD-10-CM | POA: Diagnosis not present

## 2022-07-12 DIAGNOSIS — M25562 Pain in left knee: Secondary | ICD-10-CM | POA: Diagnosis not present

## 2022-07-12 DIAGNOSIS — Z96652 Presence of left artificial knee joint: Secondary | ICD-10-CM | POA: Diagnosis not present

## 2022-07-13 DIAGNOSIS — M1711 Unilateral primary osteoarthritis, right knee: Secondary | ICD-10-CM | POA: Diagnosis not present

## 2022-07-13 DIAGNOSIS — Z96652 Presence of left artificial knee joint: Secondary | ICD-10-CM | POA: Diagnosis not present

## 2022-07-13 DIAGNOSIS — Z471 Aftercare following joint replacement surgery: Secondary | ICD-10-CM | POA: Diagnosis not present

## 2022-07-14 NOTE — Discharge Summary (Signed)
Patient ID: Chase Knapp MRN: [id number] DOB/AGE: 05-02-1946 76 y.o.  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 male who was admitted 06/30/2022 for operative treatment ofS/P total knee arthroplasty, left. Patient has severe unremitting pain that affects sleep, daily activities, and work/hobbies. After pre-op clearance the patient was taken to the operating room on 06/30/2022 and underwent  Procedure(s): TOTAL KNEE ARTHROPLASTY.    Patient was given perioperative antibiotics:  Anti-infectives (From admission, onward)    Start     Dose/Rate Route Frequency Ordered Stop   06/30/22 1330  ceFAZolin (ANCEF) IVPB 2g/100 mL premix        2 g 200 mL/hr over 30 Minutes Intravenous Every 6 hours 06/30/22 1001 06/30/22 2020   06/30/22 0600  ceFAZolin (ANCEF) IVPB 2g/100 mL premix        2 g 200 mL/hr over 30 Minutes Intravenous On call to O.R. 06/30/22 0093 06/30/22 0743        Patient was given sequential compression devices, early ambulation, and chemoprophylaxis to prevent DVT. Patient worked with PT and was meeting their goals regarding safe ambulation and transfers.  Patient benefited maximally from hospital stay and there were no complications.    Recent vital signs: No data found.   Recent laboratory studies: No results for input(s): "WBC", "HGB", "HCT", "PLT", "NA", "K",  "CL", "CO2", "BUN", "CREATININE", "GLUCOSE", "INR", "CALCIUM" in the last 72 hours.  Invalid input(s): "PT", "2"   Discharge Medications:   Allergies as of 07/01/2022       Reactions   Ibuprofen Itching, Rash        Medication List     TAKE these medications    acetaminophen 500 MG tablet Commonly known as: TYLENOL Take 2 tablets (1,000 mg total) by mouth every 6 (six) hours.   aspirin 81 MG chewable tablet Chew 1 tablet (81 mg total) by mouth 2 (two) times daily for 28 days.   CALCIUM 600+D PO Take 1 tablet by mouth 3 (three) times a week.   cyanocobalamin 500 MCG tablet Commonly known as: VITAMIN B12 Take 500 mcg by mouth 3 (three) times a week.   docusate sodium 100 MG capsule Commonly known as: COLACE Take 1 capsule (100 mg total) by mouth 2 (two) times daily.   Fish Oil 1200 MG Caps Take 1,200 mg by mouth 3 (three) times a week.   Glucosamine Chondroitin Triple Tabs Take 1 tablet by mouth 3 (three) times a week.   methocarbamol 500 MG tablet Commonly known as: ROBAXIN Take 1 tablet (500 mg total) by mouth every 6 (six) hours as needed for muscle spasms.   multivitamin with minerals Tabs tablet Take 1 tablet by mouth 3 (three) times a week.   oxyCODONE 5 MG immediate release tablet Commonly known as: Oxy IR/ROXICODONE Take 1 tablet (5 mg total) by mouth every 4 (  four) hours as needed for severe pain.   polyethylene glycol 17 g packet Commonly known as: MIRALAX / GLYCOLAX Take 17 g by mouth daily as needed for mild constipation.   vitamin C 1000 MG tablet Take 1,000 mg by mouth 3 (three) times a week.   Vitamin D 50 MCG (2000 UT) tablet Take 2,000 Units by mouth 3 (three) times a week.   vitamin E 180 MG (400 UNITS) capsule Take 400 Units by mouth 3 (three) times a week.   Zinc 25 MG Tabs Take 25 mg by mouth 3 (three) times a week.               Discharge Care Instructions  (From admission, onward)           Start     Ordered    07/01/22 0000  Change dressing       Comments: Maintain surgical dressing until follow up in the clinic. If the edges start to pull up, may reinforce with tape. If the dressing is no longer working, may remove and cover with gauze and tape, but must keep the area dry and clean.  Call with any questions or concerns.   07/01/22 0737            Diagnostic Studies: No results found.  Disposition: Discharge disposition: 01-Home or Self Care       Discharge Instructions     Call MD / Call 911   Complete by: As directed    If you experience chest pain or shortness of breath, CALL 911 and be transported to the hospital emergency room.  If you develope a fever above 101 F, pus (white drainage) or increased drainage or redness at the wound, or calf pain, call your surgeon's office.   Change dressing   Complete by: As directed    Maintain surgical dressing until follow up in the clinic. If the edges start to pull up, may reinforce with tape. If the dressing is no longer working, may remove and cover with gauze and tape, but must keep the area dry and clean.  Call with any questions or concerns.   Constipation Prevention   Complete by: As directed    Drink plenty of fluids.  Prune juice may be helpful.  You may use a stool softener, such as Colace (over the counter) 100 mg twice a day.  Use MiraLax (over the counter) for constipation as needed.   Diet - low sodium heart healthy   Complete by: As directed    Increase activity slowly as tolerated   Complete by: As directed    Weight bearing as tolerated with assist device (walker, cane, etc) as directed, use it as long as suggested by your surgeon or therapist, typically at least 4-6 weeks.   Post-operative opioid taper instructions:   Complete by: As directed    POST-OPERATIVE OPIOID TAPER INSTRUCTIONS: It is important to wean off of your opioid medication as soon as possible. If you do not need pain medication after your surgery it is ok to  stop day one. Opioids include: Codeine, Hydrocodone(Norco, Vicodin), Oxycodone(Percocet, oxycontin) and hydromorphone amongst others.  Long term and even short term use of opiods can cause: Increased pain response Dependence Constipation Depression Respiratory depression And more.  Withdrawal symptoms can include Flu like symptoms Nausea, vomiting And more Techniques to manage these symptoms Hydrate well Eat regular healthy meals Stay active Use relaxation techniques(deep breathing, meditating, yoga) Do Not substitute Alcohol to help with tapering  If you have been on opioids for less than two weeks and do not have pain than it is ok to stop all together.  Plan to wean off of opioids This plan should start within one week post op of your joint replacement. Maintain the same interval or time between taking each dose and first decrease the dose.  Cut the total daily intake of opioids by one tablet each day Next start to increase the time between doses. The last dose that should be eliminated is the evening dose.      TED hose   Complete by: As directed    Use stockings (TED hose) for 2 weeks on both leg(s).  You may remove them at night for sleeping.        Follow-up Information     Irving Copas, PA-C. Go on 07/13/2022.   Specialty: Orthopedic Surgery Why: You are scheduled for a follow up appointment on 07-13-22 at 10:45 am. Contact information: [address] [place] [address] [phone]                  Signed: Irving Copas 07/14/2022, 7:15 AM

## 2022-07-15 DIAGNOSIS — R2689 Other abnormalities of gait and mobility: Secondary | ICD-10-CM | POA: Diagnosis not present

## 2022-07-15 DIAGNOSIS — M62552 Muscle wasting and atrophy, not elsewhere classified, left thigh: Secondary | ICD-10-CM | POA: Diagnosis not present

## 2022-07-15 DIAGNOSIS — M25562 Pain in left knee: Secondary | ICD-10-CM | POA: Diagnosis not present

## 2022-07-15 DIAGNOSIS — Z96652 Presence of left artificial knee joint: Secondary | ICD-10-CM | POA: Diagnosis not present

## 2022-07-18 DIAGNOSIS — M62552 Muscle wasting and atrophy, not elsewhere classified, left thigh: Secondary | ICD-10-CM | POA: Diagnosis not present

## 2022-07-18 DIAGNOSIS — Z96652 Presence of left artificial knee joint: Secondary | ICD-10-CM | POA: Diagnosis not present

## 2022-07-18 DIAGNOSIS — M25562 Pain in left knee: Secondary | ICD-10-CM | POA: Diagnosis not present

## 2022-07-18 DIAGNOSIS — R2689 Other abnormalities of gait and mobility: Secondary | ICD-10-CM | POA: Diagnosis not present

## 2022-07-21 DIAGNOSIS — R2689 Other abnormalities of gait and mobility: Secondary | ICD-10-CM | POA: Diagnosis not present

## 2022-07-21 DIAGNOSIS — M25562 Pain in left knee: Secondary | ICD-10-CM | POA: Diagnosis not present

## 2022-07-21 DIAGNOSIS — M62552 Muscle wasting and atrophy, not elsewhere classified, left thigh: Secondary | ICD-10-CM | POA: Diagnosis not present

## 2022-07-21 DIAGNOSIS — Z96652 Presence of left artificial knee joint: Secondary | ICD-10-CM | POA: Diagnosis not present

## 2022-07-25 DIAGNOSIS — M62552 Muscle wasting and atrophy, not elsewhere classified, left thigh: Secondary | ICD-10-CM | POA: Diagnosis not present

## 2022-07-25 DIAGNOSIS — R2689 Other abnormalities of gait and mobility: Secondary | ICD-10-CM | POA: Diagnosis not present

## 2022-07-25 DIAGNOSIS — Z96652 Presence of left artificial knee joint: Secondary | ICD-10-CM | POA: Diagnosis not present

## 2022-07-25 DIAGNOSIS — M25562 Pain in left knee: Secondary | ICD-10-CM | POA: Diagnosis not present

## 2022-07-27 DIAGNOSIS — R2689 Other abnormalities of gait and mobility: Secondary | ICD-10-CM | POA: Diagnosis not present

## 2022-07-27 DIAGNOSIS — M62552 Muscle wasting and atrophy, not elsewhere classified, left thigh: Secondary | ICD-10-CM | POA: Diagnosis not present

## 2022-07-27 DIAGNOSIS — M25562 Pain in left knee: Secondary | ICD-10-CM | POA: Diagnosis not present

## 2022-07-27 DIAGNOSIS — Z96652 Presence of left artificial knee joint: Secondary | ICD-10-CM | POA: Diagnosis not present

## 2022-08-02 DIAGNOSIS — M25562 Pain in left knee: Secondary | ICD-10-CM | POA: Diagnosis not present

## 2022-08-02 DIAGNOSIS — Z96652 Presence of left artificial knee joint: Secondary | ICD-10-CM | POA: Diagnosis not present

## 2022-08-02 DIAGNOSIS — M62552 Muscle wasting and atrophy, not elsewhere classified, left thigh: Secondary | ICD-10-CM | POA: Diagnosis not present

## 2022-08-02 DIAGNOSIS — R2689 Other abnormalities of gait and mobility: Secondary | ICD-10-CM | POA: Diagnosis not present

## 2022-08-03 DIAGNOSIS — H25812 Combined forms of age-related cataract, left eye: Secondary | ICD-10-CM | POA: Diagnosis not present

## 2022-08-03 DIAGNOSIS — Z01818 Encounter for other preprocedural examination: Secondary | ICD-10-CM | POA: Diagnosis not present

## 2022-08-03 DIAGNOSIS — H52212 Irregular astigmatism, left eye: Secondary | ICD-10-CM | POA: Diagnosis not present

## 2022-08-04 DIAGNOSIS — M62552 Muscle wasting and atrophy, not elsewhere classified, left thigh: Secondary | ICD-10-CM | POA: Diagnosis not present

## 2022-08-04 DIAGNOSIS — Z96652 Presence of left artificial knee joint: Secondary | ICD-10-CM | POA: Diagnosis not present

## 2022-08-04 DIAGNOSIS — M25562 Pain in left knee: Secondary | ICD-10-CM | POA: Diagnosis not present

## 2022-08-04 DIAGNOSIS — R2689 Other abnormalities of gait and mobility: Secondary | ICD-10-CM | POA: Diagnosis not present

## 2022-08-09 DIAGNOSIS — Z96652 Presence of left artificial knee joint: Secondary | ICD-10-CM | POA: Diagnosis not present

## 2022-08-09 DIAGNOSIS — R2689 Other abnormalities of gait and mobility: Secondary | ICD-10-CM | POA: Diagnosis not present

## 2022-08-09 DIAGNOSIS — M25562 Pain in left knee: Secondary | ICD-10-CM | POA: Diagnosis not present

## 2022-08-09 DIAGNOSIS — M62552 Muscle wasting and atrophy, not elsewhere classified, left thigh: Secondary | ICD-10-CM | POA: Diagnosis not present

## 2022-08-10 DIAGNOSIS — M1711 Unilateral primary osteoarthritis, right knee: Secondary | ICD-10-CM | POA: Diagnosis not present

## 2022-08-10 DIAGNOSIS — Z96652 Presence of left artificial knee joint: Secondary | ICD-10-CM | POA: Diagnosis not present

## 2022-08-12 DIAGNOSIS — R2689 Other abnormalities of gait and mobility: Secondary | ICD-10-CM | POA: Diagnosis not present

## 2022-08-12 DIAGNOSIS — M25562 Pain in left knee: Secondary | ICD-10-CM | POA: Diagnosis not present

## 2022-08-12 DIAGNOSIS — M62552 Muscle wasting and atrophy, not elsewhere classified, left thigh: Secondary | ICD-10-CM | POA: Diagnosis not present

## 2022-08-12 DIAGNOSIS — Z96652 Presence of left artificial knee joint: Secondary | ICD-10-CM | POA: Diagnosis not present

## 2022-08-24 DIAGNOSIS — H52202 Unspecified astigmatism, left eye: Secondary | ICD-10-CM | POA: Diagnosis not present

## 2022-08-24 DIAGNOSIS — H25812 Combined forms of age-related cataract, left eye: Secondary | ICD-10-CM | POA: Diagnosis not present

## 2022-08-24 DIAGNOSIS — H2512 Age-related nuclear cataract, left eye: Secondary | ICD-10-CM | POA: Diagnosis not present

## 2022-08-24 DIAGNOSIS — H269 Unspecified cataract: Secondary | ICD-10-CM | POA: Diagnosis not present

## 2022-08-31 DIAGNOSIS — H269 Unspecified cataract: Secondary | ICD-10-CM | POA: Diagnosis not present

## 2022-08-31 DIAGNOSIS — H2511 Age-related nuclear cataract, right eye: Secondary | ICD-10-CM | POA: Diagnosis not present

## 2022-09-13 DIAGNOSIS — Z0000 Encounter for general adult medical examination without abnormal findings: Secondary | ICD-10-CM | POA: Diagnosis not present

## 2022-09-29 NOTE — Progress Notes (Addendum)
COVID Vaccine Completed:  No  Date of COVID positive in last 90 days:  No  PCP - Elaina Pattee Cardiologist - Norm Salt, MD  Chest x-ray - N/A EKG - 04-29-22 on chart Stress Test - N/A ECHO - 05-16-22 [facility] Cardiac Cath - 2021 [facility] Pacemaker/ICD device last checked: Spinal Cord Stimulator:N/A Cardiac Monitor - 02-16-21 [facility]  Bowel Prep - N/A  Sleep Study - N/A CPAP -   Fasting Blood Sugar - N/A Checks Blood Sugar _____ times a day  Last dose of GLP1 agonist-  N/A GLP1 instructions:  N/A   Last dose of SGLT-2 inhibitors-  N/A SGLT-2 instructions: N/A  Blood Thinner Instructions: N/A Aspirin Instructions: Last Dose:  Activity level:  Can go up a flight of stairs and perform activities of daily living without stopping and without symptoms of chest pain or shortness of breath.  Anesthesia review:  Afib with hx of ablation, Dilated thoracic aorta, hx of PCI  Patient denies shortness of breath, fever, cough and chest pain at PAT appointment  Patient verbalized understanding of instructions that were given to them at the PAT appointment. Patient was also instructed that they will need to review over the PAT instructions again at home before surgery.

## 2022-09-29 NOTE — Patient Instructions (Addendum)
SURGICAL WAITING ROOM VISITATION Patients having surgery or a procedure may have no more than 2 support people in the waiting area - these visitors may rotate.   Children under the age of 35 must have an adult with them who is not the patient. If the patient needs to stay at the hospital during part of their recovery, the visitor guidelines for inpatient rooms apply. Pre-op nurse will coordinate an appropriate time for 1 support person to accompany patient in pre-op.  This support person may not rotate.    Please refer to the [employer] website for the visitor guidelines for Inpatients (after your surgery is over and you are in a regular room).      Your procedure is scheduled on: 10-11-22   Report to [facility] Main Entrance    Report to admitting at 5:15  AM   Call this number if you have problems the morning of surgery [phone]   Do not eat food :After Midnight.   After Midnight you may have the following liquids until 4:15 AM DAY OF SURGERY  Water Non-Citrus Juices (without pulp, NO RED) Carbonated Beverages Black Coffee (NO MILK/CREAM OR CREAMERS, sugar ok)  Clear Tea (NO MILK/CREAM OR CREAMERS, sugar ok) regular and decaf                             Plain Jell-O (NO RED)                                           Fruit ices (not with fruit pulp, NO RED)                                     Popsicles (NO RED)                                                               Sports drinks like Gatorade (NO RED)                   The day of surgery:  Drink ONE (1) Pre-Surgery Clear Ensure at 4:15 AM the morning of surgery. Drink in one sitting. Do not sip.  This drink was given to you during your hospital  pre-op appointment visit. Nothing else to drink after completing the Pre-Surgery Clear Ensure.          If you have questions, please contact your surgeon's office.   FOLLOW  ANY ADDITIONAL PRE OP INSTRUCTIONS YOU RECEIVED FROM YOUR SURGEON'S OFFICE!!!      Oral Hygiene is also important to reduce your risk of infection.                                    Remember - BRUSH YOUR TEETH THE MORNING OF SURGERY WITH YOUR REGULAR TOOTHPASTE   Do NOT smoke after Midnight   Take these medicines the morning of surgery with A SIP OF WATER: Tylenol if needed  You may not have any metal on your body including  jewelry, and body piercing             Do not wear lotions, powders, cologne, or deodorant              Men may shave face and neck.   Do not bring valuables to the hospital. [place].   Contacts, dentures or bridgework may not be worn into surgery.   Bring small overnight bag day of surgery.   DO NOT [place]. PHARMACY WILL DISPENSE MEDICATIONS LISTED ON YOUR MEDICATION LIST TO YOU DURING YOUR ADMISSION [place]!                Please read over the following fact sheets you were given: IF [place]  If you received a COVID test during your pre-op visit  it is requested that you wear a mask when out in public, stay away from anyone that may not be feeling well and notify your surgeon if you develop symptoms. If you test positive for Covid or have been in contact with anyone that has tested positive in the last 10 days please notify you surgeon.  [place] - Preparing for Surgery Before surgery, you can play an important role.  Because skin is not sterile, your skin needs to be as free of germs as possible.  You can reduce the number of germs on your skin by washing with CHG (chlorahexidine gluconate) soap before surgery.  CHG is an antiseptic cleaner which kills germs and bonds with the skin to continue killing germs even after washing. Please DO NOT use if you have an allergy to CHG or antibacterial soaps.  If your skin becomes reddened/irritated stop using the CHG  and inform your nurse when you arrive at Short Stay. Do not shave (including legs and underarms) for at least 48 hours prior to the first CHG shower.  You may shave your face/neck.  Please follow these instructions carefully:  1.  Shower with CHG Soap the night before surgery and the  morning of surgery.  2.  If you choose to wash your hair, wash your hair first as usual with your normal  shampoo.  3.  After you shampoo, rinse your hair and body thoroughly to remove the shampoo.                             4.  Use CHG as you would any other liquid soap.  You can apply chg directly to the skin and wash.  Gently with a scrungie or clean washcloth.  5.  Apply the CHG Soap to your body ONLY FROM THE NECK DOWN.   Do   not use on face/ open                           Wound or open sores. Avoid contact with eyes, ears mouth and   genitals (private parts).                       Wash face,  Genitals (private parts) with your normal soap.             6.  Wash thoroughly, paying special attention to the area where your  surgery  will be performed.  7.  Thoroughly rinse your body with warm water from the neck down.  8.  DO NOT shower/wash with your normal soap after using and rinsing off the CHG Soap.                9.  Pat yourself dry with a clean towel.            10.  Wear clean pajamas.            11.  Place clean sheets on your bed the night of your first shower and do not  sleep with pets. Day of Surgery : Do not apply any lotions/deodorants the morning of surgery.  Please wear clean clothes to the hospital/surgery center.  FAILURE TO FOLLOW THESE INSTRUCTIONS MAY RESULT IN THE CANCELLATION OF YOUR SURGERY  PATIENT SIGNATURE_________________________________  NURSE SIGNATURE__________________________________  ________________________________________________________________________    Adam Phenix  An incentive spirometer is a tool that can help keep your lungs clear and active. This  tool measures how well you are filling your lungs with each breath. Taking long deep breaths may help reverse or decrease the chance of developing breathing (pulmonary) problems (especially infection) following: A long period of time when you are unable to move or be active. BEFORE THE PROCEDURE  If the spirometer includes an indicator to show your best effort, your nurse or respiratory therapist will set it to a desired goal. If possible, sit up straight or lean slightly forward. Try not to slouch. Hold the incentive spirometer in an upright position. INSTRUCTIONS FOR USE  Sit on the edge of your bed if possible, or sit up as far as you can in bed or on a chair. Hold the incentive spirometer in an upright position. Breathe out normally. Place the mouthpiece in your mouth and seal your lips tightly around it. Breathe in slowly and as deeply as possible, raising the piston or the ball toward the top of the column. Hold your breath for 3-5 seconds or for as long as possible. Allow the piston or ball to fall to the bottom of the column. Remove the mouthpiece from your mouth and breathe out normally. Rest for a few seconds and repeat Steps 1 through 7 at least 10 times every 1-2 hours when you are awake. Take your time and take a few normal breaths between deep breaths. The spirometer may include an indicator to show your best effort. Use the indicator as a goal to work toward during each repetition. After each set of 10 deep breaths, practice coughing to be sure your lungs are clear. If you have an incision (the cut made at the time of surgery), support your incision when coughing by placing a pillow or rolled up towels firmly against it. Once you are able to get out of bed, walk around indoors and cough well. You may stop using the incentive spirometer when instructed by your caregiver.  RISKS AND COMPLICATIONS Take your time so you do not get dizzy or light-headed. If you are in pain, you may need  to take or ask for pain medication before doing incentive spirometry. It is harder to take a deep breath if you are having pain. AFTER USE Rest and breathe slowly and easily. It can be helpful to keep track of a log of your progress. Your caregiver can provide you with a simple table to help with this. If you are using the spirometer at home, follow these instructions: [place]  IF:  You are having difficultly using the spirometer. You have trouble using the spirometer as often as instructed. Your pain medication is not giving enough relief while using the spirometer. You develop fever of 100.5 F (38.1 C) or higher. SEEK IMMEDIATE MEDICAL CARE IF:  You cough up bloody sputum that had not been present before. You develop fever of 102 F (38.9 C) or greater. You develop worsening pain at or near the incision site. MAKE SURE YOU:  Understand these instructions. Will watch your condition. Will get help right away if you are not doing well or get worse. Document Released: 02/20/2007 Document Revised: 01/02/2012 Document Reviewed: 04/23/2007 [facility] Patient Information 2014 [place], [place].   ________________________________________________________________________

## 2022-09-30 ENCOUNTER — Encounter (HOSPITAL_COMMUNITY): Payer: Self-pay

## 2022-09-30 ENCOUNTER — Other Ambulatory Visit: Payer: Self-pay

## 2022-09-30 ENCOUNTER — Encounter (HOSPITAL_COMMUNITY)
Admission: RE | Admit: 2022-09-30 | Discharge: 2022-09-30 | Disposition: A | Discharge status: Home / Self Care | Payer: Medicare Other | Source: Ambulatory Visit | Attending: Orthopedic Surgery | Admitting: Orthopedic Surgery

## 2022-09-30 VITALS — BP 149/85 | HR 63 | Temp 98.1°F | Resp 20 | Ht 67.5 in | Wt 197.2 lb

## 2022-09-30 DIAGNOSIS — Z01812 Encounter for preprocedural laboratory examination: Secondary | ICD-10-CM | POA: Diagnosis not present

## 2022-09-30 DIAGNOSIS — I2510 Atherosclerotic heart disease of native coronary artery without angina pectoris: Secondary | ICD-10-CM | POA: Diagnosis not present

## 2022-09-30 DIAGNOSIS — Z01818 Encounter for other preprocedural examination: Secondary | ICD-10-CM

## 2022-09-30 LAB — CBC
HCT: 41 % (ref 39.0–52.0)
Hemoglobin: 13.6 g/dL (ref 13.0–17.0)
MCH: 30.8 pg (ref 26.0–34.0)
MCHC: 33.2 g/dL (ref 30.0–36.0)
MCV: 93 fL (ref 80.0–100.0)
Platelets: 229 10*3/uL (ref 150–400)
RBC: 4.41 MIL/uL (ref 4.22–5.81)
RDW: 12.3 % (ref 11.5–15.5)
WBC: 6.2 10*3/uL (ref 4.0–10.5)
nRBC: 0 % (ref 0.0–0.2)

## 2022-09-30 LAB — SURGICAL PCR SCREEN
MRSA, PCR: NEGATIVE
Staphylococcus aureus: NEGATIVE

## 2022-09-30 LAB — BASIC METABOLIC PANEL
Anion gap: 10 (ref 5–15)
BUN: 18 mg/dL (ref 8–23)
CO2: 24 mmol/L (ref 22–32)
Calcium: 9.1 mg/dL (ref 8.9–10.3)
Chloride: 104 mmol/L (ref 98–111)
Creatinine, Ser: 0.55 mg/dL — ABNORMAL LOW (ref 0.61–1.24)
GFR, Estimated: >60 mL/min (ref >60)
Glucose, Bld: 93 mg/dL (ref 70–99)
Potassium: 4.5 mmol/L (ref 3.5–5.1)
Sodium: 138 mmol/L (ref 135–145)

## 2022-10-10 ENCOUNTER — Encounter (HOSPITAL_COMMUNITY): Payer: Self-pay | Admitting: Orthopedic Surgery

## 2022-10-10 NOTE — Anesthesia Preprocedure Evaluation (Signed)
Anesthesia Evaluation  Patient identified by MRN, date of birth, ID band Patient awake    Reviewed: Allergy & Precautions, NPO status , Patient's Chart, lab work & pertinent test results  History of Anesthesia Complications Negative for: history of anesthetic complications  Airway Mallampati: II  TM Distance: >3 FB Neck ROM: Full    Dental  (+) Partial Lower, Partial Upper, Dental Advisory Given   Pulmonary neg pulmonary ROS   Pulmonary exam normal        Cardiovascular (-) hypertension(-) angina (-) CHF Normal cardiovascular exam+ dysrhythmias Atrial Fibrillation      Neuro/Psych negative neurological ROS  negative psych ROS   GI/Hepatic negative GI ROS, Neg liver ROS,,,  Endo/Other  negative endocrine ROS    Renal/GU negative Renal ROS     Musculoskeletal  (+) Arthritis ,    Abdominal   Peds  Hematology negative hematology ROS (+)   Anesthesia Other Findings   Reproductive/Obstetrics                             Anesthesia Physical Anesthesia Plan  ASA: 2  Anesthesia Plan: MAC, Regional and Spinal   Post-op Pain Management: Regional block* and Ofirmev IV (intra-op)*   Induction: Intravenous  PONV Risk Score and Plan: 1 and Propofol infusion and Treatment may vary due to age or medical condition  Airway Management Planned: Nasal Cannula and Natural Airway  Additional Equipment: None  Intra-op Plan:   Post-operative Plan: Extubation in OR  Informed Consent: I have reviewed the patients History and Physical, chart, labs and discussed the procedure including the risks, benefits and alternatives for the proposed anesthesia with the patient or authorized representative who has indicated his/her understanding and acceptance.     Dental advisory given  Plan Discussed with: Anesthesiologist and CRNA  Anesthesia Plan Comments:         Anesthesia Quick Evaluation

## 2022-10-10 NOTE — H&P (Signed)
TOTAL KNEE ADMISSION H&P  Patient is being admitted for right total knee arthroplasty.  Subjective:  Chief Complaint:right knee pain.  HPI: Chase Knapp, 76 y.o. male, has a history of pain and functional disability in the right knee due to arthritis and has failed non-surgical conservative treatments for greater than 12 weeks to includeNSAID's and/or analgesics, corticosteriod injections, and activity modification.  Onset of symptoms was gradual, starting 2 years ago with gradually worsening course since that time. The patient noted no past surgery on the right knee(s).  Patient currently rates pain in the right knee(s) at 8 out of 10 with activity. Patient has worsening of pain with activity and weight bearing, pain that interferes with activities of daily living, and pain with passive range of motion.  Patient has evidence of joint space narrowing by imaging studies.  There is no active infection.  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   S/P total hip arthroplasty 09/21/2021   Past Medical History:  Diagnosis Date   Cancer ([place]) 2006   skin on Lt upper arm   Dysrhythmia 2020   A-Fib   History of cardioversion    Hypertriglyceridemia    Medical history non-contributory    MRSA infection    Right knee prolonged IV antibiotics   Osteoarthritis    Varicose veins of both lower extremities     Past Surgical History:  Procedure Laterality Date   ablation for atrial fibrillation  2021   [place] IMPLANT Bilateral    HERNIA REPAIR  1997   MELANOMA EXCISION Left 2006   arm   TOTAL HIP ARTHROPLASTY Right 09/21/2021   Procedure: TOTAL HIP ARTHROPLASTY ANTERIOR APPROACH;  Surgeon: Paralee Cancel, MD;  Location: [facility];  Service: Orthopedics;  Laterality: Right;   TOTAL KNEE ARTHROPLASTY Left 06/30/2022   Procedure: TOTAL KNEE  ARTHROPLASTY;  Surgeon: Paralee Cancel, MD;  Location: [facility] OR[facility];  Service: Orthopedics;  Laterality: Left;    No current facility-administered medications for this encounter.   Current Outpatient Medications  Medication Sig Dispense Refill Last Dose   Ascorbic Acid (VITAMIN C) 1000 MG tablet Take 1,000 mg by mouth 3 (three) times a week.      Calcium Carbonate-Vitamin D (CALCIUM 600+D PO) Take 1 tablet by mouth 3 (three) times a week.      Cholecalciferol (VITAMIN D) 50 MCG (2000 UT) tablet Take 2,000 Units by mouth 3 (three) times a week.      cyanocobalamin (VITAMIN B12) 500 MCG tablet Take 500 mcg by mouth 3 (three) times a week.      Misc Natural Products (GLUCOSAMINE CHONDROITIN TRIPLE) TABS Take 1 tablet by mouth 3 (three) times a week.      Multiple Vitamin (MULTIVITAMIN WITH MINERALS) TABS tablet Take 1 tablet by mouth 3 (three) times a week.      Omega-3 Fatty Acids (FISH OIL) 1200 MG CAPS Take 1,200 mg by mouth 3 (three) times a week.      vitamin E 180 MG (400 UNITS) capsule Take 400 Units by mouth 3 (three) times a week.      Zinc 25 MG TABS Take 25 mg by mouth 3 (three) times a week.      acetaminophen (TYLENOL) 500 MG tablet Take 2 tablets (1,000 mg total) by mouth every 6 (six) hours. 30 tablet 0    docusate sodium (  COLACE) 100 MG capsule Take 1 capsule (100 mg total) by mouth 2 (two) times daily. (Patient not taking: Reported on 09/27/2022) 10 capsule 0 Not Taking   methocarbamol (ROBAXIN) 500 MG tablet Take 1 tablet (500 mg total) by mouth every 6 (six) hours as needed for muscle spasms. (Patient not taking: Reported on 09/27/2022) 40 tablet 2 Not Taking   oxyCODONE (OXY IR/ROXICODONE) 5 MG immediate release tablet Take 1 tablet (5 mg total) by mouth every 4 (four) hours as needed for severe pain. (Patient not taking: Reported on 09/27/2022) 42 tablet 0 Not Taking   polyethylene glycol (MIRALAX / GLYCOLAX) 17 g packet Take 17 g by mouth daily as needed for mild constipation. (Patient  not taking: Reported on 09/27/2022) 14 each 0 Not Taking   Allergies  Allergen Reactions   Aleve [Naproxen] Itching and Rash    Social History   Tobacco Use   Smoking status: Never   Smokeless tobacco: Never  Substance Use Topics   Alcohol use: Never    History reviewed. No pertinent family history.   Review of Systems  Constitutional:  Negative for chills and fever.  Respiratory:  Negative for cough and shortness of breath.   Cardiovascular:  Negative for chest pain.  Gastrointestinal:  Negative for nausea and vomiting.  Musculoskeletal:  Positive for arthralgias.     Objective:  Physical Exam Well nourished and well developed. General: Alert and oriented x3, cooperative and pleasant, no acute distress. Head: normocephalic, atraumatic, neck supple.  Musculoskeletal: Left knee exam: His surgical incision remains well-healed without signs of infection AROM 0-115 No significant lower extremity edema, erythema nor any calf tenderness  Right knee exam: Slight flexion contracture with genu varum No palpable effusion warmth or erythema Tenderness medially   Calves soft and nontender. Motor function intact in LE. Strength 5/5 LE bilaterally. Neuro: Distal pulses 2+. Sensation to light touch intact in LE.  Vital signs in last 24 hours:    Labs:   Estimated body mass index is 30.43 kg/m as calculated from the following:   Height as of 09/30/22: 5' 7.5" (1.715 m).   Weight as of 09/30/22: 89.4 kg.   Imaging Review Plain radiographs demonstrate severe degenerative joint disease of the right knee(s). The overall alignment isneutral. The bone quality appears to be adequate for age and reported activity level.      Assessment/Plan:  End stage arthritis, right knee   The patient history, physical examination, clinical judgment of the provider and imaging studies are consistent with end stage degenerative joint disease of the right knee(s) and total knee  arthroplasty is deemed medically necessary. The treatment options including medical management, injection therapy arthroscopy and arthroplasty were discussed at length. The risks and benefits of total knee arthroplasty were presented and reviewed. The risks due to aseptic loosening, infection, stiffness, patella tracking problems, thromboembolic complications and other imponderables were discussed. The patient acknowledged the explanation, agreed to proceed with the plan and consent was signed. Patient is being admitted for inpatient treatment for surgery, pain control, PT, OT, prophylactic antibiotics, VTE prophylaxis, progressive ambulation and ADL's and discharge planning. The patient is planning to be discharged  home.  Therapy Plans: outpatient therapy at [facility] Disposition: Home with wife Planned DVT Prophylaxis: aspirin '81mg'$  BID DME needed: none PCP: Dr. Laqueta Due, clearance received Cardio: Dr. Glennon Mac, clearance received TXA: IV Allergies: aleve - rash Anesthesia Concerns: none BMI: 30.3 Last HgbA1c: not diabetic   Other: - oxycodone, robaxin, tylenol, celebrex - stay overnight  Patient's anticipated LOS is less than 2 midnights, meeting these requirements: - Younger than 52 - Lives within 1 hour of care - Has a competent adult at home to recover with post-op recover - NO history of  - Chronic pain requiring opiods  - Diabetes  - Coronary Artery Disease  - Heart failure  - Heart attack  - Stroke  - DVT/VTE  - Cardiac arrhythmia  - Respiratory Failure/COPD  - Renal failure  - Anemia  - Advanced Liver disease  Costella Hatcher, PA-C Orthopedic Surgery [facility] [place] [phone]

## 2022-10-11 ENCOUNTER — Other Ambulatory Visit: Payer: Self-pay

## 2022-10-11 ENCOUNTER — Encounter (HOSPITAL_COMMUNITY)
Admission: RE | Disposition: A | Discharge status: Home / Self Care | Payer: Self-pay | Source: Ambulatory Visit | Attending: Orthopedic Surgery

## 2022-10-11 ENCOUNTER — Ambulatory Visit (HOSPITAL_COMMUNITY): Payer: Medicare Other | Admitting: Physician Assistant

## 2022-10-11 ENCOUNTER — Encounter (HOSPITAL_COMMUNITY): Payer: Self-pay | Admitting: Orthopedic Surgery

## 2022-10-11 ENCOUNTER — Observation Stay (HOSPITAL_COMMUNITY)
Admission: RE | Admit: 2022-10-11 | Discharge: 2022-10-12 | Disposition: A | Discharge status: Home / Self Care | Payer: Medicare Other | Source: Ambulatory Visit | Attending: Orthopedic Surgery | Admitting: Orthopedic Surgery

## 2022-10-11 ENCOUNTER — Ambulatory Visit (HOSPITAL_BASED_OUTPATIENT_CLINIC_OR_DEPARTMENT_OTHER): Payer: Medicare Other | Admitting: Anesthesiology

## 2022-10-11 DIAGNOSIS — M25761 Osteophyte, right knee: Secondary | ICD-10-CM | POA: Diagnosis not present

## 2022-10-11 DIAGNOSIS — Z96652 Presence of left artificial knee joint: Secondary | ICD-10-CM | POA: Insufficient documentation

## 2022-10-11 DIAGNOSIS — Z85828 Personal history of other malignant neoplasm of skin: Secondary | ICD-10-CM | POA: Insufficient documentation

## 2022-10-11 DIAGNOSIS — Z79899 Other long term (current) drug therapy: Secondary | ICD-10-CM | POA: Insufficient documentation

## 2022-10-11 DIAGNOSIS — G8918 Other acute postprocedural pain: Secondary | ICD-10-CM | POA: Diagnosis not present

## 2022-10-11 DIAGNOSIS — M1711 Unilateral primary osteoarthritis, right knee: Secondary | ICD-10-CM

## 2022-10-11 DIAGNOSIS — Z96641 Presence of right artificial hip joint: Secondary | ICD-10-CM | POA: Insufficient documentation

## 2022-10-11 DIAGNOSIS — Z96651 Presence of right artificial knee joint: Secondary | ICD-10-CM

## 2022-10-11 DIAGNOSIS — I4891 Unspecified atrial fibrillation: Secondary | ICD-10-CM | POA: Insufficient documentation

## 2022-10-11 DIAGNOSIS — M25461 Effusion, right knee: Secondary | ICD-10-CM | POA: Diagnosis not present

## 2022-10-11 HISTORY — PX: TOTAL KNEE ARTHROPLASTY: SHX125

## 2022-10-11 SURGERY — ARTHROPLASTY, KNEE, TOTAL
Anesthesia: Monitor Anesthesia Care | Site: Knee | Laterality: Right

## 2022-10-11 MED ORDER — BISACODYL 10 MG RE SUPP: 10.0000 mg | Freq: Every day | RECTAL | Status: DC | PRN

## 2022-10-11 MED ORDER — CHLORHEXIDINE GLUCONATE 0.12 % MT SOLN
15.0000 mL | Freq: Once | OROMUCOSAL | Status: AC
  Administered 2022-10-11: 15 mL via OROMUCOSAL

## 2022-10-11 MED ORDER — LACTATED RINGERS IV SOLN: INTRAVENOUS | Status: DC

## 2022-10-11 MED ORDER — DOCUSATE SODIUM 100 MG PO CAPS: 100.0000 mg | ORAL_CAPSULE | Freq: Two times a day (BID) | ORAL | Status: DC

## 2022-10-11 MED ORDER — DEXAMETHASONE SODIUM PHOSPHATE 10 MG/ML IJ SOLN
INTRAMUSCULAR | Status: DC | PRN
  Administered 2022-10-11: 4 mg via INTRAVENOUS

## 2022-10-11 MED ORDER — METOCLOPRAMIDE HCL 5 MG PO TABS: 5.0000 mg | ORAL_TABLET | Freq: Three times a day (TID) | ORAL | Status: DC | PRN

## 2022-10-11 MED ORDER — AMISULPRIDE (ANTIEMETIC) 5 MG/2ML IV SOLN: 10.0000 mg | Freq: Once | INTRAVENOUS | Status: DC | PRN

## 2022-10-11 MED ORDER — FENTANYL CITRATE (PF) 100 MCG/2ML IJ SOLN
INTRAMUSCULAR | Status: DC | PRN
  Administered 2022-10-11 (×2): 50 ug via INTRAVENOUS

## 2022-10-11 MED ORDER — PROPOFOL 10 MG/ML IV BOLUS
INTRAVENOUS | Status: DC | PRN
  Administered 2022-10-11: 20 mg via INTRAVENOUS

## 2022-10-11 MED ORDER — SODIUM CHLORIDE (PF) 0.9 % IJ SOLN
INTRAMUSCULAR | Status: AC
  Filled 2022-10-11: qty 50

## 2022-10-11 MED ORDER — OXYCODONE HCL 5 MG PO TABS: 10.0000 mg | ORAL_TABLET | ORAL | Status: DC | PRN

## 2022-10-11 MED ORDER — METHOCARBAMOL 500 MG IVPB - SIMPLE MED
INTRAVENOUS | Status: AC
  Administered 2022-10-11: 500 mg via INTRAVENOUS
  Filled 2022-10-11: qty 55

## 2022-10-11 MED ORDER — HYDROMORPHONE HCL 1 MG/ML IJ SOLN: 0.5000 mg | INTRAMUSCULAR | Status: DC | PRN

## 2022-10-11 MED ORDER — ACETAMINOPHEN 500 MG PO TABS
1000.0000 mg | ORAL_TABLET | Freq: Four times a day (QID) | ORAL | Status: DC
  Administered 2022-10-11 – 2022-10-12 (×3): 1000 mg via ORAL
  Filled 2022-10-11 (×3): qty 2

## 2022-10-11 MED ORDER — PHENYLEPHRINE 80 MCG/ML (10ML) SYRINGE FOR IV PUSH (FOR BLOOD PRESSURE SUPPORT)
PREFILLED_SYRINGE | INTRAVENOUS | Status: AC
  Filled 2022-10-11: qty 10

## 2022-10-11 MED ORDER — BUPIVACAINE IN DEXTROSE 0.75-8.25 % IT SOLN
INTRATHECAL | Status: DC | PRN
  Administered 2022-10-11: 1.6 mg via INTRATHECAL

## 2022-10-11 MED ORDER — DEXAMETHASONE SODIUM PHOSPHATE 10 MG/ML IJ SOLN
INTRAMUSCULAR | Status: DC | PRN
  Administered 2022-10-11: 5 mg

## 2022-10-11 MED ORDER — ACETAMINOPHEN 10 MG/ML IV SOLN
INTRAVENOUS | Status: DC | PRN
  Administered 2022-10-11: 1000 mg via INTRAVENOUS

## 2022-10-11 MED ORDER — DEXMEDETOMIDINE HCL IN NACL 80 MCG/20ML IV SOLN
INTRAVENOUS | Status: AC
  Filled 2022-10-11: qty 20

## 2022-10-11 MED ORDER — TRANEXAMIC ACID-NACL 1000-0.7 MG/100ML-% IV SOLN
1000.0000 mg | INTRAVENOUS | Status: AC
  Administered 2022-10-11: 1000 mg via INTRAVENOUS
  Filled 2022-10-11: qty 100

## 2022-10-11 MED ORDER — FENTANYL CITRATE PF 50 MCG/ML IJ SOSY
PREFILLED_SYRINGE | INTRAMUSCULAR | Status: AC
  Administered 2022-10-11: 50 ug via INTRAVENOUS
  Filled 2022-10-11: qty 3

## 2022-10-11 MED ORDER — METOCLOPRAMIDE HCL 5 MG/ML IJ SOLN: 5.0000 mg | Freq: Three times a day (TID) | INTRAMUSCULAR | Status: DC | PRN

## 2022-10-11 MED ORDER — FENTANYL CITRATE PF 50 MCG/ML IJ SOSY
25.0000 ug | PREFILLED_SYRINGE | INTRAMUSCULAR | Status: DC | PRN
  Administered 2022-10-11: 50 ug via INTRAVENOUS
  Administered 2022-10-11: 25 ug via INTRAVENOUS

## 2022-10-11 MED ORDER — PROMETHAZINE HCL 25 MG/ML IJ SOLN: 6.2500 mg | INTRAMUSCULAR | Status: DC | PRN

## 2022-10-11 MED ORDER — POLYETHYLENE GLYCOL 3350 17 G PO PACK: 17.0000 g | PACK | Freq: Two times a day (BID) | ORAL | Status: DC

## 2022-10-11 MED ORDER — ONDANSETRON HCL 4 MG/2ML IJ SOLN
INTRAMUSCULAR | Status: AC
  Filled 2022-10-11: qty 6

## 2022-10-11 MED ORDER — PROPOFOL 1000 MG/100ML IV EMUL
INTRAVENOUS | Status: AC
  Filled 2022-10-11: qty 100

## 2022-10-11 MED ORDER — LIDOCAINE 2% (20 MG/ML) 5 ML SYRINGE
INTRAMUSCULAR | Status: DC | PRN
  Administered 2022-10-11: 80 mg via INTRAVENOUS

## 2022-10-11 MED ORDER — BUPIVACAINE-EPINEPHRINE (PF) 0.5% -1:200000 IJ SOLN
INTRAMUSCULAR | Status: AC
  Filled 2022-10-11: qty 30

## 2022-10-11 MED ORDER — MIDAZOLAM HCL 5 MG/5ML IJ SOLN
INTRAMUSCULAR | Status: DC | PRN
  Administered 2022-10-11: 2 mg via INTRAVENOUS

## 2022-10-11 MED ORDER — SODIUM CHLORIDE (PF) 0.9 % IJ SOLN
INTRAMUSCULAR | Status: DC | PRN
  Administered 2022-10-11: 30 mL

## 2022-10-11 MED ORDER — DEXAMETHASONE SODIUM PHOSPHATE 10 MG/ML IJ SOLN
INTRAMUSCULAR | Status: AC
  Filled 2022-10-11: qty 3

## 2022-10-11 MED ORDER — KETOROLAC TROMETHAMINE 30 MG/ML IJ SOLN
INTRAMUSCULAR | Status: AC
  Filled 2022-10-11: qty 1

## 2022-10-11 MED ORDER — ONDANSETRON HCL 4 MG PO TABS: 4.0000 mg | ORAL_TABLET | Freq: Four times a day (QID) | ORAL | Status: DC | PRN

## 2022-10-11 MED ORDER — POVIDONE-IODINE 10 % EX SWAB: 2.0000 | Freq: Once | CUTANEOUS | Status: DC

## 2022-10-11 MED ORDER — ASPIRIN 81 MG PO CHEW
81.0000 mg | CHEWABLE_TABLET | Freq: Two times a day (BID) | ORAL | Status: DC
  Administered 2022-10-11 – 2022-10-12 (×2): 81 mg via ORAL
  Filled 2022-10-11 (×2): qty 1

## 2022-10-11 MED ORDER — 0.9 % SODIUM CHLORIDE (POUR BTL) OPTIME
TOPICAL | Status: DC | PRN
  Administered 2022-10-11: 1000 mL

## 2022-10-11 MED ORDER — KETOROLAC TROMETHAMINE 30 MG/ML IJ SOLN
INTRAMUSCULAR | Status: DC | PRN
  Administered 2022-10-11: 30 mg

## 2022-10-11 MED ORDER — PHENOL 1.4 % MT LIQD: 1.0000 | OROMUCOSAL | Status: DC | PRN

## 2022-10-11 MED ORDER — FENTANYL CITRATE (PF) 100 MCG/2ML IJ SOLN
INTRAMUSCULAR | Status: AC
  Filled 2022-10-11: qty 2

## 2022-10-11 MED ORDER — DIPHENHYDRAMINE HCL 12.5 MG/5ML PO ELIX: 12.5000 mg | ORAL_SOLUTION | ORAL | Status: DC | PRN

## 2022-10-11 MED ORDER — ACETAMINOPHEN 10 MG/ML IV SOLN
INTRAVENOUS | Status: AC
  Filled 2022-10-11: qty 100

## 2022-10-11 MED ORDER — CEFAZOLIN SODIUM-DEXTROSE 2-4 GM/100ML-% IV SOLN
2.0000 g | Freq: Four times a day (QID) | INTRAVENOUS | Status: AC
  Administered 2022-10-11 (×2): 2 g via INTRAVENOUS
  Filled 2022-10-11 (×2): qty 100

## 2022-10-11 MED ORDER — PHENYLEPHRINE 80 MCG/ML (10ML) SYRINGE FOR IV PUSH (FOR BLOOD PRESSURE SUPPORT)
PREFILLED_SYRINGE | INTRAVENOUS | Status: DC | PRN
  Administered 2022-10-11 (×5): 80 ug via INTRAVENOUS

## 2022-10-11 MED ORDER — LIDOCAINE HCL (PF) 2 % IJ SOLN
INTRAMUSCULAR | Status: AC
  Filled 2022-10-11: qty 15

## 2022-10-11 MED ORDER — MIDAZOLAM HCL 2 MG/2ML IJ SOLN
INTRAMUSCULAR | Status: AC
  Filled 2022-10-11: qty 2

## 2022-10-11 MED ORDER — ONDANSETRON HCL 4 MG/2ML IJ SOLN: 4.0000 mg | Freq: Four times a day (QID) | INTRAMUSCULAR | Status: DC | PRN

## 2022-10-11 MED ORDER — PROPOFOL 10 MG/ML IV BOLUS
INTRAVENOUS | Status: AC
  Filled 2022-10-11: qty 20

## 2022-10-11 MED ORDER — CEFAZOLIN SODIUM-DEXTROSE 2-4 GM/100ML-% IV SOLN
2.0000 g | INTRAVENOUS | Status: AC
  Administered 2022-10-11: 2 g via INTRAVENOUS
  Filled 2022-10-11: qty 100

## 2022-10-11 MED ORDER — DEXAMETHASONE SODIUM PHOSPHATE 10 MG/ML IJ SOLN
10.0000 mg | Freq: Once | INTRAMUSCULAR | Status: AC
  Administered 2022-10-12: 10 mg via INTRAVENOUS
  Filled 2022-10-11: qty 1

## 2022-10-11 MED ORDER — DEXAMETHASONE SODIUM PHOSPHATE 10 MG/ML IJ SOLN: 8.0000 mg | Freq: Once | INTRAMUSCULAR | Status: DC

## 2022-10-11 MED ORDER — METHOCARBAMOL 500 MG PO TABS
500.0000 mg | ORAL_TABLET | Freq: Four times a day (QID) | ORAL | Status: DC | PRN
  Administered 2022-10-12: 500 mg via ORAL
  Filled 2022-10-11: qty 1

## 2022-10-11 MED ORDER — SODIUM CHLORIDE 0.9 % IR SOLN
Status: DC | PRN
  Administered 2022-10-11: 1000 mL

## 2022-10-11 MED ORDER — PROPOFOL 500 MG/50ML IV EMUL
INTRAVENOUS | Status: DC | PRN
  Administered 2022-10-11: 75 ug/kg/min via INTRAVENOUS

## 2022-10-11 MED ORDER — METHOCARBAMOL 500 MG IVPB - SIMPLE MED: 500.0000 mg | Freq: Four times a day (QID) | INTRAVENOUS | Status: DC | PRN

## 2022-10-11 MED ORDER — SODIUM CHLORIDE 0.9 % IV SOLN: INTRAVENOUS | Status: DC

## 2022-10-11 MED ORDER — ORAL CARE MOUTH RINSE: 15.0000 mL | Freq: Once | OROMUCOSAL | Status: AC

## 2022-10-11 MED ORDER — MENTHOL 3 MG MT LOZG: 1.0000 | LOZENGE | OROMUCOSAL | Status: DC | PRN

## 2022-10-11 MED ORDER — ACETAMINOPHEN 325 MG PO TABS: 325.0000 mg | ORAL_TABLET | Freq: Four times a day (QID) | ORAL | Status: DC | PRN

## 2022-10-11 MED ORDER — TRANEXAMIC ACID-NACL 1000-0.7 MG/100ML-% IV SOLN
1000.0000 mg | Freq: Once | INTRAVENOUS | Status: AC
  Administered 2022-10-11: 1000 mg via INTRAVENOUS
  Filled 2022-10-11: qty 100

## 2022-10-11 MED ORDER — CELECOXIB 200 MG PO CAPS
200.0000 mg | ORAL_CAPSULE | Freq: Two times a day (BID) | ORAL | Status: DC
  Administered 2022-10-11 – 2022-10-12 (×3): 200 mg via ORAL
  Filled 2022-10-11 (×3): qty 1

## 2022-10-11 MED ORDER — OXYCODONE HCL 5 MG PO TABS
5.0000 mg | ORAL_TABLET | ORAL | Status: DC | PRN
  Administered 2022-10-11 – 2022-10-12 (×3): 10 mg via ORAL
  Filled 2022-10-11 (×3): qty 2

## 2022-10-11 MED ORDER — ONDANSETRON HCL 4 MG/2ML IJ SOLN
INTRAMUSCULAR | Status: DC | PRN
  Administered 2022-10-11: 4 mg via INTRAVENOUS

## 2022-10-11 MED ORDER — ROPIVACAINE HCL 7.5 MG/ML IJ SOLN
INTRAMUSCULAR | Status: DC | PRN
  Administered 2022-10-11: 20 mL via PERINEURAL

## 2022-10-11 MED ORDER — BUPIVACAINE-EPINEPHRINE (PF) 0.5% -1:200000 IJ SOLN
INTRAMUSCULAR | Status: DC | PRN
  Administered 2022-10-11: 30 mL

## 2022-10-11 MED ORDER — DEXMEDETOMIDINE HCL IN NACL 80 MCG/20ML IV SOLN
INTRAVENOUS | Status: DC | PRN
  Administered 2022-10-11: 4 ug via BUCCAL

## 2022-10-11 SURGICAL SUPPLY — 51 items
ATTUNE MED ANAT PAT 41 KNEE (Knees) IMPLANT
ATTUNE PS FEM RT SZ 6 CEM KNEE (Femur) IMPLANT
ATTUNE PSRP INSR SZ6 7 KNEE (Insert) IMPLANT
BAG COUNTER SPONGE SURGICOUNT (BAG) IMPLANT
BAG ZIPLOCK 12X15 (MISCELLANEOUS) IMPLANT
BASE TIBIAL ROT PLAT SZ 8 KNEE (Knees) IMPLANT
BLADE SAW SGTL 11.0X1.19X90.0M (BLADE) IMPLANT
BLADE SAW SGTL 13.0X1.19X90.0M (BLADE) ×1 IMPLANT
BNDG ELASTIC 4X5.8 VLCR STR LF (GAUZE/BANDAGES/DRESSINGS) IMPLANT
BNDG ELASTIC 6X5.8 VLCR STR LF (GAUZE/BANDAGES/DRESSINGS) ×1 IMPLANT
BOWL SMART MIX CTS (DISPOSABLE) ×1 IMPLANT
CEMENT HV SMART SET (Cement) IMPLANT
CUFF TOURN SGL QUICK 34 (TOURNIQUET CUFF) ×1
CUFF TRNQT CYL 34X4.125X (TOURNIQUET CUFF) ×1 IMPLANT
DERMABOND ADVANCED .7 DNX12 (GAUZE/BANDAGES/DRESSINGS) ×1 IMPLANT
DRAPE U-SHAPE 47X51 STRL (DRAPES) ×1 IMPLANT
DRESSING AQUACEL AG SP 3.5X10 (GAUZE/BANDAGES/DRESSINGS) ×1 IMPLANT
DRSG AQUACEL AG ADV 3.5X10 (GAUZE/BANDAGES/DRESSINGS) IMPLANT
DRSG AQUACEL AG SP 3.5X10 (GAUZE/BANDAGES/DRESSINGS) ×1
DURAPREP 26ML APPLICATOR (WOUND CARE) ×2 IMPLANT
ELECT REM PT RETURN 15FT ADLT (MISCELLANEOUS) ×1 IMPLANT
GLOVE BIO SURGEON STRL SZ 6 (GLOVE) ×1 IMPLANT
GLOVE BIOGEL PI IND STRL 6.5 (GLOVE) ×1 IMPLANT
GLOVE BIOGEL PI IND STRL 7.5 (GLOVE) ×1 IMPLANT
GLOVE ORTHO TXT STRL SZ7.5 (GLOVE) ×2 IMPLANT
GOWN STRL REUS W/ TWL LRG LVL3 (GOWN DISPOSABLE) ×2 IMPLANT
GOWN STRL REUS W/TWL LRG LVL3 (GOWN DISPOSABLE) ×2
HANDPIECE INTERPULSE COAX TIP (DISPOSABLE) ×1
HOLDER FOLEY CATH W/STRAP (MISCELLANEOUS) IMPLANT
KIT TURNOVER KIT A (KITS) IMPLANT
MANIFOLD NEPTUNE II (INSTRUMENTS) ×1 IMPLANT
NDL SAFETY ECLIP 18X1.5 (MISCELLANEOUS) IMPLANT
NS IRRIG 1000ML POUR BTL (IV SOLUTION) ×1 IMPLANT
PACK TOTAL KNEE CUSTOM (KITS) ×1 IMPLANT
PIN FIX SIGMA LCS THRD HI (PIN) IMPLANT
PROTECTOR NERVE ULNAR (MISCELLANEOUS) ×1 IMPLANT
SET HNDPC FAN SPRY TIP SCT (DISPOSABLE) ×1 IMPLANT
SET PAD KNEE POSITIONER (MISCELLANEOUS) ×1 IMPLANT
SPIKE FLUID TRANSFER (MISCELLANEOUS) ×2 IMPLANT
SUT MNCRL AB 4-0 PS2 18 (SUTURE) ×1 IMPLANT
SUT STRATAFIX PDS+ 0 24IN (SUTURE) ×1 IMPLANT
SUT VIC AB 1 CT1 36 (SUTURE) ×1 IMPLANT
SUT VIC AB 2-0 CT1 27 (SUTURE) ×2
SUT VIC AB 2-0 CT1 TAPERPNT 27 (SUTURE) ×2 IMPLANT
SYR 3ML LL SCALE MARK (SYRINGE) ×1 IMPLANT
TIBIAL BASE ROT PLAT SZ 8 KNEE (Knees) ×1 IMPLANT
TOWEL GREEN STERILE FF (TOWEL DISPOSABLE) ×1 IMPLANT
TRAY FOLEY MTR SLVR 16FR STAT (SET/KITS/TRAYS/PACK) ×1 IMPLANT
TUBE SUCTION HIGH CAP CLEAR NV (SUCTIONS) ×1 IMPLANT
WATER STERILE IRR 1000ML POUR (IV SOLUTION) ×2 IMPLANT
WRAP KNEE MAXI GEL POST OP (GAUZE/BANDAGES/DRESSINGS) ×1 IMPLANT

## 2022-10-11 NOTE — Interval H&P Note (Signed)
History and Physical Interval Note:  10/11/2022 7:01 AM  Chase Knapp  has presented today for surgery, with the diagnosis of Right knee osteoarthritis.  The various methods of treatment have been discussed with the patient and family. After consideration of risks, benefits and other options for treatment, the patient has consented to  Procedure(s): TOTAL KNEE ARTHROPLASTY (Right) as a surgical intervention.  The patient's history has been reviewed, patient examined, no change in status, stable for surgery.  I have reviewed the patient's chart and labs.  Questions were answered to the patient's satisfaction.     [name]

## 2022-10-11 NOTE — Transfer of Care (Signed)
Immediate Anesthesia Transfer of Care Note  Patient: Chase Knapp  Procedure(s) Performed: Procedure(s): TOTAL KNEE ARTHROPLASTY (Right)  Patient Location: PACU  Anesthesia Type:Spinal  Level of Consciousness:  sedated, patient cooperative and responds to stimulation  Airway & Oxygen Therapy:Patient Spontanous Breathing and Patient connected to face mask oxgen  Post-op Assessment:  Report given to PACU RN and Post -op Vital signs reviewed and stable  Post vital signs:  Reviewed and stable  Last Vitals:  Vitals:   10/11/22 0554  BP: (!) 178/101  Pulse: (!) 58  Resp: 16  Temp: 36.6 C  SpO2: 93%    Complications: No apparent anesthesia complications

## 2022-10-11 NOTE — Op Note (Signed)
NAME:  Chase Knapp                      MEDICAL RECORD NO.:  [id number]                             FACILITY:  [facility]      PHYSICIAN:  Pietro Cassis. Alvan Dame, M.D.  DATE OF BIRTH:  10/11/1946      DATE OF PROCEDURE:  10/11/2022                                     OPERATIVE REPORT         PREOPERATIVE DIAGNOSIS:  Right knee osteoarthritis.      POSTOPERATIVE DIAGNOSIS:  Right knee osteoarthritis.      FINDINGS:  The patient was noted to have complete loss of cartilage and   bone-on-bone arthritis with associated osteophytes in the medial and patellofemoral compartments of   the knee with a significant synovitis and associated effusion.  The patient had failed months of conservative treatment including medications, injection therapy, activity modification.     PROCEDURE:  Right total knee replacement.      COMPONENTS USED:  DePuy Attune rotating platform posterior stabilized knee   system, a size 6 femur, 8 tibia, size 7 mm PS AOX insert, and 41 anatomic patellar   button.      SURGEON:  Pietro Cassis. Alvan Dame, M.D.      ASSISTANT:  Costella Hatcher, PA-C.      ANESTHESIA:  Regional and Spinal.      SPECIMENS:  None.      COMPLICATION:  None.      DRAINS:  None.  EBL: <100 cc      TOURNIQUET TIME:   Total Tourniquet Time Documented: Thigh (Right) - 32 minutes Total: Thigh (Right) - 32 minutes  .      The patient was stable to the recovery room.      INDICATION FOR PROCEDURE:  Chase Knapp is a 76 y.o. male patient of   mine.  The patient had been seen, evaluated, and treated for months conservatively in the   office with medication, activity modification, and injections.  The patient had   radiographic changes of bone-on-bone arthritis with endplate sclerosis and osteophytes noted.  Based on the radiographic changes and failed conservative measures, the patient   decided to proceed with definitive treatment, total knee replacement.  Risks of infection, DVT, component failure, need  for revision surgery, neurovascular injury were reviewed in the office setting.  The postop course was reviewed stressing the efforts to maximize post-operative satisfaction and function.  Consent was obtained for benefit of pain   relief.      PROCEDURE IN DETAIL:  The patient was brought to the operative theater.   Once adequate anesthesia, preoperative antibiotics, 2 gm of Ancef,1 gm of Tranexamic Acid, and 10 mg of Decadron administered, the patient was positioned supine with a right thigh tourniquet placed.  The  right lower extremity was prepped and draped in sterile fashion.  A time-   out was performed identifying the patient, planned procedure, and the appropriate extremity.      The right lower extremity was placed in the [facility] leg holder.  The leg was   exsanguinated, tourniquet elevated to 250 mmHg.  A midline incision was   made followed  by median parapatellar arthrotomy.  Following initial   exposure, attention was first directed to the patella.  Precut   measurement was noted to be 26 mm.  I resected down to 14 mm and used a   41 anatomic patellar button to restore patellar height as well as cover the cut surface.      The lug holes were drilled and a metal shim was placed to protect the   patella from retractors and saw blade during the procedure.      At this point, attention was now directed to the femur.  The femoral   canal was opened with a drill, irrigated to try to prevent fat emboli.  An   intramedullary rod was passed at 5 degrees valgus, 10 mm of bone was   resected off the distal femur.  Following this resection, the tibia was   subluxated anteriorly.  Using the extramedullary guide, 2 mm of bone was resected off   the proximal medial tibia.  We confirmed the gap would be   stable medially and laterally with a size 6 spacer block as well as confirmed that the tibial cut was perpendicular in the coronal plane, checking with an alignment rod.      Once this was  done, I sized the femur to be a size 6 in the anterior-   posterior dimension, chose a standard component based on medial and   lateral dimension.  The size 6 rotation block was then pinned in   position anterior referenced using the C-clamp to set rotation.  The   anterior, posterior, and  chamfer cuts were made without difficulty nor   notching making certain that I was along the anterior cortex to help   with flexion gap stability.      The final box cut was made off the lateral aspect of distal femur.      At this point, the tibia was sized to be a size 8.  The size 8 tray was   then pinned in position through the medial third of the tubercle,   drilled, and keel punched.  Trial reduction was now carried with a 6 femur,  8 tibia, a size 7 mm PS insert, and the 41 anatomic patella botton.  The knee was brought to full extension with good flexion stability with the patella   tracking through the trochlea without application of pressure.  Given   all these findings the trial components removed.  Final components were   opened and cement was mixed.  The knee was irrigated with normal saline solution and pulse lavage.  The synovial lining was   then injected with 30 cc of 0.25% Marcaine with epinephrine, 1 cc of Toradol and 30 cc of NS for a total of 61 cc.     Final implants were then cemented onto cleaned and dried cut surfaces of bone with the knee brought to extension with a 7 mm PS trial insert.      Once the cement had fully cured, excess cement was removed   throughout the knee.  I confirmed that I was satisfied with the range of   motion and stability, and the final size 7 mm PS AOX insert was chosen.  It was   placed into the knee.      The tourniquet had been let down at 32 minutes.  No significant   hemostasis was required.  The extensor mechanism was then reapproximated using #1 Vicryl and #1 Stratafix  sutures with the knee   in flexion.  The   remaining wound was closed with  2-0 Vicryl and running 4-0 Monocryl.   The knee was cleaned, dried, dressed sterilely using Dermabond and   Aquacel dressing.  The patient was then   brought to recovery room in stable condition, tolerating the procedure   well.   Please note that Physician Assistant, Costella Hatcher, PA-C was present for the entirety of the case, and was utilized for pre-operative positioning, peri-operative retractor management, general facilitation of the procedure and for primary wound closure at the end of the case.              Pietro Cassis Alvan Dame, M.D.    10/11/2022 8:45 AM

## 2022-10-11 NOTE — Anesthesia Procedure Notes (Signed)
Anesthesia Regional Block: Adductor canal block   Pre-Anesthetic Checklist: , timeout performed,  Correct Patient, Correct Site, Correct Laterality,  Correct Procedure, Correct Position, site marked,  Risks and benefits discussed,  Surgical consent,  Pre-op evaluation,  At surgeon's request and post-op pain management  Laterality: Right  Prep: chloraprep       Needles:  Injection technique: Single-shot  Needle Type: Stimulator Needle - 80     Needle Length: 10cm  Needle Gauge: 21     Additional Needles:   Narrative:  Start time: 10/11/2022 6:52 AM End time: 10/11/2022 7:02 AM Injection made incrementally with aspirations every 5 mL.  Performed by: Personally  Anesthesiologist: Duane Boston, MD

## 2022-10-11 NOTE — Anesthesia Postprocedure Evaluation (Signed)
Anesthesia Post Note  Patient: Chase Knapp  Procedure(s) Performed: TOTAL KNEE ARTHROPLASTY (Right: Knee)     Patient location during evaluation: PACU Anesthesia Type: Spinal and MAC Level of consciousness: awake and alert Pain management: pain level controlled Vital Signs Assessment: post-procedure vital signs reviewed and stable Respiratory status: spontaneous breathing and respiratory function stable Cardiovascular status: stable Postop Assessment: no apparent nausea or vomiting Anesthetic complications: no  No notable events documented.  Last Vitals:  Vitals:   10/11/22 0930 10/11/22 0945  BP: 133/77 123/71  Pulse: (!) 52 (!) 48  Resp: 14 16  Temp:    SpO2: 100% 100%    Last Pain:  Vitals:   10/11/22 0945  TempSrc:   PainSc: 2                  Valeria Boza DANIEL

## 2022-10-11 NOTE — Discharge Instructions (Signed)

## 2022-10-11 NOTE — Anesthesia Procedure Notes (Signed)
Spinal  Patient location during procedure: OR Start time: 10/11/2022 7:29 AM End time: 10/11/2022 7:36 AM Reason for block: surgical anesthesia Staffing Performed: anesthesiologist  Anesthesiologist: Duane Boston, MD Performed by: Duane Boston, MD Authorized by: Duane Boston, MD   Preanesthetic Checklist Completed: patient identified, IV checked, risks and benefits discussed, surgical consent, monitors and equipment checked, pre-op evaluation and timeout performed Spinal Block Patient position: sitting Prep: DuraPrep Patient monitoring: cardiac monitor, continuous pulse ox and blood pressure Approach: midline Location: L2-3 Injection technique: single-shot Needle Needle type: Pencan  Needle gauge: 24 G Needle length: 9 cm Assessment Events: CSF return Additional Notes Functioning IV was confirmed and monitors were applied. Sterile prep and drape, including hand hygiene and sterile gloves were used. The patient was positioned and the spine was prepped. The skin was anesthetized with lidocaine.  Free flow of clear CSF was obtained prior to injecting local anesthetic into the CSF.  The spinal needle aspirated freely following injection.  The needle was carefully withdrawn.  The patient tolerated the procedure well.

## 2022-10-11 NOTE — Care Plan (Signed)
Ortho Bundle Case Management Note  Patient Details  Name: Chase Knapp MRN: [id number] Date of Birth: August 22, 1946  R TKA on 10-11-22 DCP:  Home with wife DME:  No needs, has a RW PT:  ProPT on 10-14-22                    DME Arranged:  N/A DME Agency:  NA  HH Arranged:  NA [place] Agency:  NA  Additional Comments: Please contact me with any questions of if this plan should need to change.  Marianne Sofia, RN,CCM [facility]  [phone] 10/11/2022, 5:07 PM

## 2022-10-11 NOTE — Evaluation (Signed)
Physical Therapy Evaluation Patient Details Name: Chase Knapp MRN: [id number] DOB: April 06, 1946 Today's Date: 10/11/2022  History of Present Illness  76 yo male s/p R TKA 10/11/22. Hx of L TKA 9/23. Hx of R THA DA 2022, LBP, dizziness  Clinical Impression  On eval POD 0, pt was Min guard A for mobility. He walked ~115 feet with a RW. Minimal pain during session. Pt tolerated activity well. Assisted back to bed at pt's request. Will continue to progress activity as tolerated. Plan is for d/c home on tomorrow.        Recommendations for follow up therapy are one component of a multi-disciplinary discharge planning process, led by the attending physician.  Recommendations may be updated based on patient status, additional functional criteria and insurance authorization.  Follow Up Recommendations Follow physician's recommendations for discharge plan and follow up therapies      Assistance Recommended at Discharge Intermittent Supervision/Assistance  Patient can return home with the following  A little help with walking and/or transfers;A little help with bathing/dressing/bathroom;Help with stairs or ramp for entrance    Equipment Recommendations None recommended by PT  Recommendations for Other Services       Functional Status Assessment Patient has had a recent decline in their functional status and demonstrates the ability to make significant improvements in function in a reasonable and predictable amount of time.     Precautions / Restrictions Precautions Precautions: Fall Restrictions Weight Bearing Restrictions: No Other Position/Activity Restrictions: WBAT      Mobility  Bed Mobility Overal bed mobility: Needs Assistance Bed Mobility: Supine to Sit, Sit to Supine     Supine to sit: Supervision, HOB elevated Sit to supine: Supervision, HOB elevated   General bed mobility comments: Supv for lines, safety    Transfers Overall transfer level: Needs  assistance Equipment used: Rolling walker (2 wheels) Transfers: Sit to/from Stand Sit to Stand: Min guard           General transfer comment: Min guard A for safety.    Ambulation/Gait Ambulation/Gait assistance: Min guard Gait Distance (Feet): 115 Feet Assistive device: Rolling walker (2 wheels) Gait Pattern/deviations: Step-through pattern, Decreased stride length       General Gait Details: Min guard A for mobility. Tolerated distance well. No dizziness.  Stairs            Wheelchair Mobility    Modified Rankin (Stroke Patients Only)       Balance Overall balance assessment: Needs assistance         Standing balance support: Bilateral upper extremity supported, During functional activity, Reliant on assistive device for balance Standing balance-Leahy Scale: Fair                               Pertinent Vitals/Pain Pain Assessment Pain Assessment: Faces Faces Pain Scale: Hurts a little bit Pain Location: R knee Pain Descriptors / Indicators: Discomfort, Sore Pain Intervention(s): Limited activity within patient's tolerance, Monitored during session, Ice applied, Repositioned    Home Living Family/patient expects to be discharged to:: Private residence Living Arrangements: Spouse/significant other Available Help at Discharge: Family;Available 24 hours/day Type of Home: House Home Access: Stairs to enter;Ramped entrance Entrance Stairs-Rails: Right;Left Entrance Stairs-Number of Steps: 5   Home Layout: Two level;Able to live on main level with bedroom/bathroom Home Equipment: Grab bars - tub/shower;Shower Land (2 wheels);Cane - single point;Adaptive equipment      Prior Function Prior Level of Function :  Independent/Modified Independent;Driving             Mobility Comments: IND ADLs Comments: IND     Hand Dominance        Extremity/Trunk Assessment             Cervical / Trunk Assessment Cervical /  Trunk Assessment: Normal  Communication   Communication: HOH  Cognition Arousal/Alertness: Awake/alert Behavior During Therapy: WFL for tasks assessed/performed Overall Cognitive Status: Within Functional Limits for tasks assessed                                          General Comments      Exercises     Assessment/Plan    PT Assessment Patient needs continued PT services  PT Problem List Decreased strength;Decreased range of motion;Decreased activity tolerance;Decreased balance;Decreased mobility;Pain;Decreased knowledge of use of DME       PT Treatment Interventions DME instruction;Gait training;Therapeutic exercise;Balance training;Stair training;Functional mobility training;Therapeutic activities;Patient/family education    PT Goals (Current goals can be found in the Care Plan section)  Acute Rehab PT Goals Patient Stated Goal: to get back on my harley PT Goal Formulation: With patient/family Time For Goal Achievement: 10/25/22 Potential to Achieve Goals: Good    Frequency 7X/week     Co-evaluation               AM-PAC PT "6 Clicks" Mobility  Outcome Measure Help needed turning from your back to your side while in a flat bed without using bedrails?: A Little Help needed moving from lying on your back to sitting on the side of a flat bed without using bedrails?: A Little Help needed moving to and from a bed to a chair (including a wheelchair)?: A Little Help needed standing up from a chair using your arms (e.g., wheelchair or bedside chair)?: A Little Help needed to walk in hospital room?: A Little Help needed climbing 3-5 steps with a railing? : A Little 6 Click Score: 18    End of Session Equipment Utilized During Treatment: Gait belt Activity Tolerance: Patient tolerated treatment well Patient left: in bed;with call bell/phone within reach;with bed alarm set   PT Visit Diagnosis: Pain;Other abnormalities of gait and mobility  (R26.89) Pain - Right/Left: Right Pain - part of body: Knee    Time: 5248-1859 PT Time Calculation (min) (ACUTE ONLY): 19 min   Charges:   PT Evaluation $PT Eval Low Complexity: Sig[place], PT Acute Rehabilitation  Office: [phone]

## 2022-10-11 NOTE — Plan of Care (Signed)
  Problem: Pain Management: Goal: Pain level will decrease with appropriate interventions Outcome: Progressing   Problem: Nutrition: Goal: Adequate nutrition will be maintained Outcome: Progressing   

## 2022-10-12 ENCOUNTER — Encounter (HOSPITAL_COMMUNITY): Payer: Self-pay | Admitting: Orthopedic Surgery

## 2022-10-12 DIAGNOSIS — Z79899 Other long term (current) drug therapy: Secondary | ICD-10-CM | POA: Diagnosis not present

## 2022-10-12 DIAGNOSIS — I4891 Unspecified atrial fibrillation: Secondary | ICD-10-CM | POA: Diagnosis not present

## 2022-10-12 DIAGNOSIS — M1711 Unilateral primary osteoarthritis, right knee: Secondary | ICD-10-CM | POA: Diagnosis not present

## 2022-10-12 DIAGNOSIS — Z96641 Presence of right artificial hip joint: Secondary | ICD-10-CM | POA: Diagnosis not present

## 2022-10-12 DIAGNOSIS — Z85828 Personal history of other malignant neoplasm of skin: Secondary | ICD-10-CM | POA: Diagnosis not present

## 2022-10-12 DIAGNOSIS — Z96652 Presence of left artificial knee joint: Secondary | ICD-10-CM | POA: Diagnosis not present

## 2022-10-12 LAB — CBC
HCT: 34.9 % — ABNORMAL LOW (ref 39.0–52.0)
Hemoglobin: 11.7 g/dL — ABNORMAL LOW (ref 13.0–17.0)
MCH: 30.4 pg (ref 26.0–34.0)
MCHC: 33.5 g/dL (ref 30.0–36.0)
MCV: 90.6 fL (ref 80.0–100.0)
Platelets: 197 10*3/uL (ref 150–400)
RBC: 3.85 MIL/uL — ABNORMAL LOW (ref 4.22–5.81)
RDW: 12.1 % (ref 11.5–15.5)
WBC: 12.8 10*3/uL — ABNORMAL HIGH (ref 4.0–10.5)
nRBC: 0 % (ref 0.0–0.2)

## 2022-10-12 LAB — BASIC METABOLIC PANEL
Anion gap: 6 (ref 5–15)
BUN: 20 mg/dL (ref 8–23)
CO2: 25 mmol/L (ref 22–32)
Calcium: 8.6 mg/dL — ABNORMAL LOW (ref 8.9–10.3)
Chloride: 105 mmol/L (ref 98–111)
Creatinine, Ser: 0.72 mg/dL (ref 0.61–1.24)
GFR, Estimated: >60 mL/min (ref >60)
Glucose, Bld: 128 mg/dL — ABNORMAL HIGH (ref 70–99)
Potassium: 4.3 mmol/L (ref 3.5–5.1)
Sodium: 136 mmol/L (ref 135–145)

## 2022-10-12 MED ORDER — CELECOXIB 200 MG PO CAPS: 200.0000 mg | ORAL_CAPSULE | Freq: Two times a day (BID) | ORAL | 0 refills | Status: AC

## 2022-10-12 MED ORDER — TRANEXAMIC ACID 650 MG PO TABS: 1950.0000 mg | ORAL_TABLET | Freq: Every day | ORAL | 0 refills | Status: AC

## 2022-10-12 MED ORDER — ASPIRIN 81 MG PO CHEW: 81.0000 mg | CHEWABLE_TABLET | Freq: Two times a day (BID) | ORAL | 0 refills | Status: AC

## 2022-10-12 MED ORDER — METHOCARBAMOL 500 MG PO TABS: 500.0000 mg | ORAL_TABLET | Freq: Four times a day (QID) | ORAL | 2 refills | Status: AC | PRN

## 2022-10-12 MED ORDER — POLYETHYLENE GLYCOL 3350 17 G PO PACK: 17.0000 g | PACK | Freq: Two times a day (BID) | ORAL | 0 refills | Status: AC

## 2022-10-12 MED ORDER — OXYCODONE HCL 5 MG PO TABS: 5.0000 mg | ORAL_TABLET | ORAL | 0 refills | Status: AC | PRN

## 2022-10-12 NOTE — Progress Notes (Signed)
   Subjective: 1 Day Post-Op Procedure(s) (LRB): TOTAL KNEE ARTHROPLASTY (Right) Patient reports pain as mild.   Patient seen in rounds by Dr. Alvan Dame. Patient is well, and has had no acute complaints or problems. No acute events overnight. Foley catheter removed. Patient ambulated 115 feet with PT.  We will continue therapy today.   Objective: Vital signs in last 24 hours: Temp:  [96.8 F (36 C)-98 F (36.7 C)] 97.5 F (36.4 C) (12/20 0540) Pulse Rate:  [48-75] 63 (12/20 0540) Resp:  [12-18] 17 (12/20 0540) BP: (114-144)/(62-85) 126/79 (12/20 0540) SpO2:  [93 %-100 %] 96 % (12/20 0540)  Intake/Output from previous day:  Intake/Output Summary (Last 24 hours) at 10/12/2022 0715 Last data filed at 10/12/2022 0600 Gross per 24 hour  Intake 2120 ml  Output 3000 ml  Net -880 ml     Intake/Output this shift: No intake/output data recorded.  Labs: Recent Labs    10/12/22 0332  HGB 11.7*   Recent Labs    10/12/22 0332  WBC 12.8*  RBC 3.85*  HCT 34.9*  PLT 197   Recent Labs    10/12/22 0332  NA 136  K 4.3  CL 105  CO2 25  BUN 20  CREATININE 0.72  GLUCOSE 128*  CALCIUM 8.6*   No results for input(s): "LABPT", "INR" in the last 72 hours.  Exam: General - Patient is Alert and Oriented Extremity - Neurologically intact Sensation intact distally Intact pulses distally Dorsiflexion/Plantar flexion intact Dressing - dressing C/D/I Motor Function - intact, moving foot and toes well on exam.   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   Weight as of this encounter: 89.4 kg. Advance diet Up with therapy D/C IV fluids   Patient's anticipated LOS is less than 2 midnights, meeting these requirements: - Younger than 74 - Lives within 1 hour of care - Has a competent adult at home to recover with post-op recover - NO history of  - Chronic pain requiring opiods  - Diabetes  - Coronary Artery Disease  - Heart failure  - Heart attack  - Stroke  - DVT/VTE  - Cardiac arrhythmia  - Respiratory Failure/COPD  - Renal failure  - Anemia  - Advanced Liver disease     DVT Prophylaxis - Aspirin Weight bearing as tolerated.  Hgb stable at 11.7 this AM.  Plan is to go Home after hospital stay. Plan for discharge today following 1-2 sessions of PT as long as they are meeting their goals. Patient is scheduled for OPPT. Follow up in the office in 2 weeks.   Griffith Citron, PA-C Orthopedic Surgery [phone] 10/12/2022, 7:15 AM

## 2022-10-12 NOTE — Progress Notes (Signed)
Physical Therapy Treatment Patient Details Name: Chase Knapp MRN: [id number] DOB: 1945-12-02 Today's Date: 10/12/2022   History of Present Illness 76 yo male s/p R TKA 10/11/22. Hx of L TKA 9/23. Hx of R THA DA 2022, LBP, dizziness    PT Comments    Pt continues to progress well. Reviewed exercises, gait training, and stair training. Encouraged pt to ambulate often at home, as tolerated. Recommended he practice quad sets and knee flexion as tolerated. All PT education completed.     Recommendations for follow up therapy are one component of a multi-disciplinary discharge planning process, led by the attending physician.  Recommendations may be updated based on patient status, additional functional criteria and insurance authorization.  Follow Up Recommendations  Follow physician's recommendations for discharge plan and follow up therapies     Assistance Recommended at Discharge Intermittent Supervision/Assistance  Patient can return home with the following A little help with walking and/or transfers;A little help with bathing/dressing/bathroom;Help with stairs or ramp for entrance   Equipment Recommendations  None recommended by PT    Recommendations for Other Services       Precautions / Restrictions Precautions Precautions: Fall Restrictions Weight Bearing Restrictions: No RLE Weight Bearing: Weight bearing as tolerated     Mobility  Bed Mobility Overal bed mobility: Modified Independent                  Transfers Overall transfer level: Needs assistance Equipment used: Rolling walker (2 wheels) Transfers: Sit to/from Stand Sit to Stand: Supervision           General transfer comment: Supv for safety, cues.    Ambulation/Gait Ambulation/Gait assistance: Supervision Gait Distance (Feet): 200 Feet Assistive device: Rolling walker (2 wheels) Gait Pattern/deviations: Step-through pattern, Decreased stride length       General Gait Details: Supv for  safety. Tolerated distance well. No LOB.   Stairs Stairs: Yes Stairs assistance: Supervision Stair Management: One rail Right, Step to pattern, Forwards Number of Stairs: 2 General stair comments: up and over portable stairs x 1. cues for safety, sequence.   Wheelchair Mobility    Modified Rankin (Stroke Patients Only)       Balance Overall balance assessment: Mild deficits observed, not formally tested                                          Cognition Arousal/Alertness: Awake/alert Behavior During Therapy: WFL for tasks assessed/performed Overall Cognitive Status: Within Functional Limits for tasks assessed                                          Exercises Total Joint Exercises Ankle Circles/Pumps: AROM, Both, 10 reps Quad Sets: AROM, Both, 10 reps Heel Slides: AROM, Right, 10 reps Straight Leg Raises: AROM, Right, 10 reps Goniometric ROM: ~10-65 degrees    General Comments        Pertinent Vitals/Pain Pain Assessment Pain Assessment: Faces Faces Pain Scale: Hurts a little bit Pain Location: R knee/thigh Pain Descriptors / Indicators: Discomfort, Sore Pain Intervention(s): Limited activity within patient's tolerance, Monitored during session, Ice applied, Repositioned    Home Living                          Prior Function  PT Goals (current goals can now be found in the care plan section) Progress towards PT goals: Progressing toward goals    Frequency    7X/week      PT Plan Current plan remains appropriate    Co-evaluation              AM-PAC PT "6 Clicks" Mobility   Outcome Measure  Help needed turning from your back to your side while in a flat bed without using bedrails?: None Help needed moving from lying on your back to sitting on the side of a flat bed without using bedrails?: None Help needed moving to and from a bed to a chair (including a wheelchair)?: A Little Help  needed standing up from a chair using your arms (e.g., wheelchair or bedside chair)?: A Little Help needed to walk in hospital room?: A Little Help needed climbing 3-5 steps with a railing? : A Little 6 Click Score: 20    End of Session Equipment Utilized During Treatment: Gait belt Activity Tolerance: Patient tolerated treatment well Patient left: in bed;with call bell/phone within reach;with bed alarm set   PT Visit Diagnosis: Pain;Other abnormalities of gait and mobility (R26.89) Pain - Right/Left: Right Pain - part of body: Knee     Time: 0922-0940 PT Time Calculation (min) (ACUTE ONLY): 18 min  Charges:  $Gait Training: 8-22 mins                        Doreatha Massed, PT Acute Rehabilitation  Office: [phone]

## 2022-10-12 NOTE — TOC Transition Note (Signed)
Transition of Care [facility]) - CM/SW Discharge Note   Patient Details  Name: Chase Knapp MRN: [id number] Date of Birth: [birth year]  Transition of Care [facility]) CM/SW Contact:  Lennart Pall, LCSW Phone Number: 10/12/2022, 10:00 AM   Clinical Narrative:    Met with pt and confirming he has needed DME at home.  OPPT already arranged with [facility].  No TOC needs.   Final next level of care: OP Rehab Barriers to Discharge: No Barriers Identified   Patient Goals and CMS Choice Patient states their goals for this hospitalization and ongoing recovery are:: return home        Discharge Placement                       Discharge Plan and Services                DME Arranged: N/A DME Agency: NA       HH Arranged: NA HH Agency: NA        Social Determinants of Health (SDOH) Interventions     Readmission Risk Interventions     No data to display

## 2022-10-14 DIAGNOSIS — R2689 Other abnormalities of gait and mobility: Secondary | ICD-10-CM | POA: Diagnosis not present

## 2022-10-14 DIAGNOSIS — M25561 Pain in right knee: Secondary | ICD-10-CM | POA: Diagnosis not present

## 2022-10-14 DIAGNOSIS — M6281 Muscle weakness (generalized): Secondary | ICD-10-CM | POA: Diagnosis not present

## 2022-10-18 DIAGNOSIS — R2689 Other abnormalities of gait and mobility: Secondary | ICD-10-CM | POA: Diagnosis not present

## 2022-10-18 DIAGNOSIS — M6281 Muscle weakness (generalized): Secondary | ICD-10-CM | POA: Diagnosis not present

## 2022-10-18 DIAGNOSIS — Z96651 Presence of right artificial knee joint: Secondary | ICD-10-CM | POA: Diagnosis not present

## 2022-10-20 DIAGNOSIS — M25561 Pain in right knee: Secondary | ICD-10-CM | POA: Diagnosis not present

## 2022-10-20 DIAGNOSIS — M6281 Muscle weakness (generalized): Secondary | ICD-10-CM | POA: Diagnosis not present

## 2022-10-20 DIAGNOSIS — Z96651 Presence of right artificial knee joint: Secondary | ICD-10-CM | POA: Diagnosis not present

## 2022-10-20 DIAGNOSIS — R2689 Other abnormalities of gait and mobility: Secondary | ICD-10-CM | POA: Diagnosis not present

## 2022-10-21 DIAGNOSIS — M25561 Pain in right knee: Secondary | ICD-10-CM | POA: Diagnosis not present

## 2022-10-21 DIAGNOSIS — M6281 Muscle weakness (generalized): Secondary | ICD-10-CM | POA: Diagnosis not present

## 2022-10-21 DIAGNOSIS — Z96651 Presence of right artificial knee joint: Secondary | ICD-10-CM | POA: Diagnosis not present

## 2022-10-21 DIAGNOSIS — R2689 Other abnormalities of gait and mobility: Secondary | ICD-10-CM | POA: Diagnosis not present

## 2022-10-25 DIAGNOSIS — M6281 Muscle weakness (generalized): Secondary | ICD-10-CM | POA: Diagnosis not present

## 2022-10-25 DIAGNOSIS — M25561 Pain in right knee: Secondary | ICD-10-CM | POA: Diagnosis not present

## 2022-10-25 DIAGNOSIS — R2689 Other abnormalities of gait and mobility: Secondary | ICD-10-CM | POA: Diagnosis not present

## 2022-10-25 DIAGNOSIS — Z96651 Presence of right artificial knee joint: Secondary | ICD-10-CM | POA: Diagnosis not present

## 2022-10-27 DIAGNOSIS — R2689 Other abnormalities of gait and mobility: Secondary | ICD-10-CM | POA: Diagnosis not present

## 2022-10-27 DIAGNOSIS — Z96651 Presence of right artificial knee joint: Secondary | ICD-10-CM | POA: Diagnosis not present

## 2022-10-27 DIAGNOSIS — M6281 Muscle weakness (generalized): Secondary | ICD-10-CM | POA: Diagnosis not present

## 2022-10-27 DIAGNOSIS — M25561 Pain in right knee: Secondary | ICD-10-CM | POA: Diagnosis not present

## 2022-10-30 NOTE — Discharge Summary (Signed)
Patient ID: Chase Knapp MRN: [id number] DOB/AGE: Jan 07, 1946 77 y.o.  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 male who was admitted 10/11/2022 for operative treatment ofS/P total knee arthroplasty, right. Patient has severe unremitting pain that affects sleep, daily activities, and work/hobbies. After pre-op clearance the patient was taken to the operating room on 10/11/2022 and underwent  Procedure(s): TOTAL KNEE ARTHROPLASTY.    Patient was given perioperative antibiotics:  Anti-infectives (From admission, onward)    Start     Dose/Rate Route Frequency Ordered Stop   10/11/22 1400  ceFAZolin (ANCEF) IVPB 2g/100 mL premix        2 g 200 mL/hr over 30 Minutes Intravenous Every 6 hours 10/11/22 1233 10/12/22 1109   10/11/22 0600  ceFAZolin (ANCEF) IVPB 2g/100 mL premix        2 g 200 mL/hr over 30 Minutes Intravenous On call to O.R. 10/11/22 0530 10/11/22 0973        Patient was given sequential compression devices, early ambulation, and chemoprophylaxis to prevent DVT. Patient worked with PT and was meeting their goals regarding safe ambulation and transfers.  Patient benefited maximally from hospital stay and there were no complications.    Recent vital signs: No data found.   Recent laboratory studies: No results for input(s): "WBC", "HGB",  "HCT", "PLT", "NA", "K", "CL", "CO2", "BUN", "CREATININE", "GLUCOSE", "INR", "CALCIUM" in the last 72 hours.  Invalid input(s): "PT", "2"   Discharge Medications:   Allergies as of 10/12/2022       Reactions   Aleve [naproxen] Itching, Rash        Medication List     STOP taking these medications    docusate sodium 100 MG capsule Commonly known as: COLACE       TAKE these medications    acetaminophen 500 MG tablet Commonly known as: TYLENOL Take 2 tablets (1,000 mg total) by mouth every 6 (six) hours.   aspirin 81 MG chewable tablet Chew 1 tablet (81 mg total) by mouth 2 (two) times daily for 28 days.   CALCIUM 600+D PO Take 1 tablet by mouth 3 (three) times a week.   celecoxib 200 MG capsule Commonly known as: CELEBREX Take 1 capsule (200 mg total) by mouth 2 (two) times daily.   cyanocobalamin 500 MCG tablet Commonly known as: VITAMIN B12 Take 500 mcg by mouth 3 (three) times a week.   Fish Oil 1200 MG Caps Take 1,200 mg by mouth 3 (three) times a week.   Glucosamine Chondroitin Triple Tabs Take 1 tablet by mouth 3 (three) times a week.   methocarbamol 500 MG tablet Commonly known as: ROBAXIN Take 1 tablet (500 mg total) by mouth every 6 (six) hours as needed for muscle spasms.   multivitamin with minerals Tabs tablet Take 1 tablet by mouth 3 (three) times  a week.   oxyCODONE 5 MG immediate release tablet Commonly known as: Oxy IR/ROXICODONE Take 1 tablet (5 mg total) by mouth every 4 (four) hours as needed for severe pain.   polyethylene glycol 17 g packet Commonly known as: MIRALAX / GLYCOLAX Take 17 g by mouth 2 (two) times daily. What changed:  when to take this reasons to take this   vitamin C 1000 MG tablet Take 1,000 mg by mouth 3 (three) times a week.   Vitamin D 50 MCG (2000 UT) tablet Take 2,000 Units by mouth 3 (three) times a week.   vitamin E 180 MG (400 UNITS) capsule Take 400 Units by mouth 3 (three) times a week.   Zinc  25 MG Tabs Take 25 mg by mouth 3 (three) times a week.       ASK your doctor about these medications    tranexamic acid 650 MG Tabs tablet Commonly known as: LYSTEDA Take 3 tablets (1,950 mg total) by mouth daily for 3 days. Ask about: Should I take this medication?               Discharge Care Instructions  (From admission, onward)           Start     Ordered   10/12/22 0000  Change dressing       Comments: Maintain surgical dressing until follow up in the clinic. If the edges start to pull up, may reinforce with tape. If the dressing is no longer working, may remove and cover with gauze and tape, but must keep the area dry and clean.  Call with any questions or concerns.   10/12/22 0718            Diagnostic Studies: No results found.  Disposition: Discharge disposition: 01-Home or Self Care       Discharge Instructions     Call MD / Call 911   Complete by: As directed    If you experience chest pain or shortness of breath, CALL 911 and be transported to the hospital emergency room.  If you develope a fever above 101 F, pus (white drainage) or increased drainage or redness at the wound, or calf pain, call your surgeon's office.   Change dressing   Complete by: As directed    Maintain surgical dressing until follow up in the clinic. If the edges start to pull up, may reinforce with tape. If the dressing is no longer working, may remove and cover with gauze and tape, but must keep the area dry and clean.  Call with any questions or concerns.   Constipation Prevention   Complete by: As directed    Drink plenty of fluids.  Prune juice may be helpful.  You may use a stool softener, such as Colace (over the counter) 100 mg twice a day.  Use MiraLax (over the counter) for constipation as needed.   Diet - low sodium heart healthy   Complete by: As directed    Increase activity slowly as tolerated   Complete by: As directed    Weight bearing as tolerated with  assist device (walker, cane, etc) as directed, use it as long as suggested by your surgeon or therapist, typically at least 4-6 weeks.   Post-operative opioid taper instructions:   Complete by: As directed    POST-OPERATIVE OPIOID TAPER INSTRUCTIONS: It is important to wean off of your opioid medication as soon as possible. If you do not need pain medication after your surgery it  is ok to stop day one. Opioids include: Codeine, Hydrocodone(Norco, Vicodin), Oxycodone(Percocet, oxycontin) and hydromorphone amongst others.  Long term and even short term use of opiods can cause: Increased pain response Dependence Constipation Depression Respiratory depression And more.  Withdrawal symptoms can include Flu like symptoms Nausea, vomiting And more Techniques to manage these symptoms Hydrate well Eat regular healthy meals Stay active Use relaxation techniques(deep breathing, meditating, yoga) Do Not substitute Alcohol to help with tapering If you have been on opioids for less than two weeks and do not have pain than it is ok to stop all together.  Plan to wean off of opioids This plan should start within one week post op of your joint replacement. Maintain the same interval or time between taking each dose and first decrease the dose.  Cut the total daily intake of opioids by one tablet each day Next start to increase the time between doses. The last dose that should be eliminated is the evening dose.      TED hose   Complete by: As directed    Use stockings (TED hose) for 2 weeks on both leg(s).  You may remove them at night for sleeping.        Follow-up Information     Paralee Cancel, MD. Go on 10/26/2022.   Specialty: Orthopedic Surgery Why: You are scheduled for a follow up appointment on 10-26-22 at 11:00 am. Contact information: [address] [place] [address] [phone]                  Signed: Irving Copas 10/30/2022, 7:36 AM

## 2022-10-31 DIAGNOSIS — R2689 Other abnormalities of gait and mobility: Secondary | ICD-10-CM | POA: Diagnosis not present

## 2022-10-31 DIAGNOSIS — M25561 Pain in right knee: Secondary | ICD-10-CM | POA: Diagnosis not present

## 2022-10-31 DIAGNOSIS — Z96651 Presence of right artificial knee joint: Secondary | ICD-10-CM | POA: Diagnosis not present

## 2022-10-31 DIAGNOSIS — M6281 Muscle weakness (generalized): Secondary | ICD-10-CM | POA: Diagnosis not present

## 2022-11-02 DIAGNOSIS — Z96651 Presence of right artificial knee joint: Secondary | ICD-10-CM | POA: Diagnosis not present

## 2022-11-02 DIAGNOSIS — M25561 Pain in right knee: Secondary | ICD-10-CM | POA: Diagnosis not present

## 2022-11-02 DIAGNOSIS — M6281 Muscle weakness (generalized): Secondary | ICD-10-CM | POA: Diagnosis not present

## 2022-11-02 DIAGNOSIS — R2689 Other abnormalities of gait and mobility: Secondary | ICD-10-CM | POA: Diagnosis not present

## 2022-11-04 DIAGNOSIS — Z96651 Presence of right artificial knee joint: Secondary | ICD-10-CM | POA: Diagnosis not present

## 2022-11-04 DIAGNOSIS — Z471 Aftercare following joint replacement surgery: Secondary | ICD-10-CM | POA: Diagnosis not present

## 2022-11-04 DIAGNOSIS — T8130XA Disruption of wound, unspecified, initial encounter: Secondary | ICD-10-CM | POA: Diagnosis not present

## 2022-11-08 DIAGNOSIS — M6281 Muscle weakness (generalized): Secondary | ICD-10-CM | POA: Diagnosis not present

## 2022-11-08 DIAGNOSIS — M25561 Pain in right knee: Secondary | ICD-10-CM | POA: Diagnosis not present

## 2022-11-08 DIAGNOSIS — R2689 Other abnormalities of gait and mobility: Secondary | ICD-10-CM | POA: Diagnosis not present

## 2022-11-08 DIAGNOSIS — Z96651 Presence of right artificial knee joint: Secondary | ICD-10-CM | POA: Diagnosis not present

## 2022-11-10 DIAGNOSIS — Z96651 Presence of right artificial knee joint: Secondary | ICD-10-CM | POA: Diagnosis not present

## 2022-11-10 DIAGNOSIS — M25561 Pain in right knee: Secondary | ICD-10-CM | POA: Diagnosis not present

## 2022-11-10 DIAGNOSIS — M6281 Muscle weakness (generalized): Secondary | ICD-10-CM | POA: Diagnosis not present

## 2022-11-10 DIAGNOSIS — R2689 Other abnormalities of gait and mobility: Secondary | ICD-10-CM | POA: Diagnosis not present

## 2022-11-14 DIAGNOSIS — R2689 Other abnormalities of gait and mobility: Secondary | ICD-10-CM | POA: Diagnosis not present

## 2022-11-14 DIAGNOSIS — M6281 Muscle weakness (generalized): Secondary | ICD-10-CM | POA: Diagnosis not present

## 2022-11-14 DIAGNOSIS — Z96651 Presence of right artificial knee joint: Secondary | ICD-10-CM | POA: Diagnosis not present

## 2022-11-14 DIAGNOSIS — M25561 Pain in right knee: Secondary | ICD-10-CM | POA: Diagnosis not present

## 2022-11-16 DIAGNOSIS — Z471 Aftercare following joint replacement surgery: Secondary | ICD-10-CM | POA: Diagnosis not present

## 2022-11-17 DIAGNOSIS — M25561 Pain in right knee: Secondary | ICD-10-CM | POA: Diagnosis not present

## 2022-11-17 DIAGNOSIS — Z96651 Presence of right artificial knee joint: Secondary | ICD-10-CM | POA: Diagnosis not present

## 2022-11-17 DIAGNOSIS — R2689 Other abnormalities of gait and mobility: Secondary | ICD-10-CM | POA: Diagnosis not present

## 2022-11-17 DIAGNOSIS — M6281 Muscle weakness (generalized): Secondary | ICD-10-CM | POA: Diagnosis not present

## 2022-11-21 DIAGNOSIS — M6281 Muscle weakness (generalized): Secondary | ICD-10-CM | POA: Diagnosis not present

## 2022-11-21 DIAGNOSIS — M25561 Pain in right knee: Secondary | ICD-10-CM | POA: Diagnosis not present

## 2022-11-21 DIAGNOSIS — Z96651 Presence of right artificial knee joint: Secondary | ICD-10-CM | POA: Diagnosis not present

## 2022-11-21 DIAGNOSIS — R2689 Other abnormalities of gait and mobility: Secondary | ICD-10-CM | POA: Diagnosis not present

## 2022-11-24 DIAGNOSIS — M6281 Muscle weakness (generalized): Secondary | ICD-10-CM | POA: Diagnosis not present

## 2022-11-24 DIAGNOSIS — M25561 Pain in right knee: Secondary | ICD-10-CM | POA: Diagnosis not present

## 2022-11-24 DIAGNOSIS — Z96651 Presence of right artificial knee joint: Secondary | ICD-10-CM | POA: Diagnosis not present

## 2022-11-24 DIAGNOSIS — R2689 Other abnormalities of gait and mobility: Secondary | ICD-10-CM | POA: Diagnosis not present

## 2022-11-28 DIAGNOSIS — M25561 Pain in right knee: Secondary | ICD-10-CM | POA: Diagnosis not present

## 2022-11-28 DIAGNOSIS — R2689 Other abnormalities of gait and mobility: Secondary | ICD-10-CM | POA: Diagnosis not present

## 2022-11-28 DIAGNOSIS — Z96651 Presence of right artificial knee joint: Secondary | ICD-10-CM | POA: Diagnosis not present

## 2022-11-28 DIAGNOSIS — M6281 Muscle weakness (generalized): Secondary | ICD-10-CM | POA: Diagnosis not present

## 2022-12-01 DIAGNOSIS — Z96651 Presence of right artificial knee joint: Secondary | ICD-10-CM | POA: Diagnosis not present

## 2022-12-01 DIAGNOSIS — M25561 Pain in right knee: Secondary | ICD-10-CM | POA: Diagnosis not present

## 2022-12-01 DIAGNOSIS — M6281 Muscle weakness (generalized): Secondary | ICD-10-CM | POA: Diagnosis not present

## 2022-12-01 DIAGNOSIS — R2689 Other abnormalities of gait and mobility: Secondary | ICD-10-CM | POA: Diagnosis not present

## 2022-12-05 DIAGNOSIS — M25561 Pain in right knee: Secondary | ICD-10-CM | POA: Diagnosis not present

## 2022-12-05 DIAGNOSIS — R2689 Other abnormalities of gait and mobility: Secondary | ICD-10-CM | POA: Diagnosis not present

## 2022-12-05 DIAGNOSIS — Z96651 Presence of right artificial knee joint: Secondary | ICD-10-CM | POA: Diagnosis not present

## 2022-12-05 DIAGNOSIS — M6281 Muscle weakness (generalized): Secondary | ICD-10-CM | POA: Diagnosis not present

## 2022-12-08 DIAGNOSIS — M25561 Pain in right knee: Secondary | ICD-10-CM | POA: Diagnosis not present

## 2022-12-08 DIAGNOSIS — Z96651 Presence of right artificial knee joint: Secondary | ICD-10-CM | POA: Diagnosis not present

## 2022-12-08 DIAGNOSIS — R2689 Other abnormalities of gait and mobility: Secondary | ICD-10-CM | POA: Diagnosis not present

## 2022-12-08 DIAGNOSIS — M6281 Muscle weakness (generalized): Secondary | ICD-10-CM | POA: Diagnosis not present

## 2022-12-12 DIAGNOSIS — R2689 Other abnormalities of gait and mobility: Secondary | ICD-10-CM | POA: Diagnosis not present

## 2022-12-12 DIAGNOSIS — M6281 Muscle weakness (generalized): Secondary | ICD-10-CM | POA: Diagnosis not present

## 2022-12-12 DIAGNOSIS — Z96651 Presence of right artificial knee joint: Secondary | ICD-10-CM | POA: Diagnosis not present

## 2022-12-12 DIAGNOSIS — M25561 Pain in right knee: Secondary | ICD-10-CM | POA: Diagnosis not present

## 2022-12-15 DIAGNOSIS — M25561 Pain in right knee: Secondary | ICD-10-CM | POA: Diagnosis not present

## 2022-12-15 DIAGNOSIS — R2689 Other abnormalities of gait and mobility: Secondary | ICD-10-CM | POA: Diagnosis not present

## 2022-12-15 DIAGNOSIS — Z96651 Presence of right artificial knee joint: Secondary | ICD-10-CM | POA: Diagnosis not present

## 2022-12-15 DIAGNOSIS — M6281 Muscle weakness (generalized): Secondary | ICD-10-CM | POA: Diagnosis not present

## 2022-12-22 DIAGNOSIS — M6281 Muscle weakness (generalized): Secondary | ICD-10-CM | POA: Diagnosis not present

## 2022-12-22 DIAGNOSIS — R2689 Other abnormalities of gait and mobility: Secondary | ICD-10-CM | POA: Diagnosis not present

## 2022-12-22 DIAGNOSIS — Z96651 Presence of right artificial knee joint: Secondary | ICD-10-CM | POA: Diagnosis not present

## 2022-12-22 DIAGNOSIS — M25561 Pain in right knee: Secondary | ICD-10-CM | POA: Diagnosis not present

## 2022-12-29 DIAGNOSIS — M25561 Pain in right knee: Secondary | ICD-10-CM | POA: Diagnosis not present

## 2022-12-29 DIAGNOSIS — R2689 Other abnormalities of gait and mobility: Secondary | ICD-10-CM | POA: Diagnosis not present

## 2022-12-29 DIAGNOSIS — M6281 Muscle weakness (generalized): Secondary | ICD-10-CM | POA: Diagnosis not present

## 2022-12-29 DIAGNOSIS — Z96651 Presence of right artificial knee joint: Secondary | ICD-10-CM | POA: Diagnosis not present

## 2023-03-29 DIAGNOSIS — R030 Elevated blood-pressure reading, without diagnosis of hypertension: Secondary | ICD-10-CM | POA: Diagnosis not present

## 2023-03-29 DIAGNOSIS — Z8679 Personal history of other diseases of the circulatory system: Secondary | ICD-10-CM | POA: Diagnosis not present

## 2023-03-29 DIAGNOSIS — L299 Pruritus, unspecified: Secondary | ICD-10-CM | POA: Diagnosis not present

## 2023-04-10 DIAGNOSIS — I10 Essential (primary) hypertension: Secondary | ICD-10-CM | POA: Diagnosis not present

## 2023-04-10 DIAGNOSIS — E781 Pure hyperglyceridemia: Secondary | ICD-10-CM | POA: Diagnosis not present

## 2023-04-10 DIAGNOSIS — R7303 Prediabetes: Secondary | ICD-10-CM | POA: Diagnosis not present

## 2023-08-16 DIAGNOSIS — Z79899 Other long term (current) drug therapy: Secondary | ICD-10-CM | POA: Diagnosis not present

## 2023-08-16 DIAGNOSIS — R7303 Prediabetes: Secondary | ICD-10-CM | POA: Diagnosis not present

## 2023-08-16 DIAGNOSIS — E785 Hyperlipidemia, unspecified: Secondary | ICD-10-CM | POA: Diagnosis not present

## 2023-09-19 DIAGNOSIS — Z0000 Encounter for general adult medical examination without abnormal findings: Secondary | ICD-10-CM | POA: Diagnosis not present

## 2023-11-06 DIAGNOSIS — Z961 Presence of intraocular lens: Secondary | ICD-10-CM | POA: Diagnosis not present

## 2023-11-06 DIAGNOSIS — H53002 Unspecified amblyopia, left eye: Secondary | ICD-10-CM | POA: Diagnosis not present

## 2023-12-18 DIAGNOSIS — Z1211 Encounter for screening for malignant neoplasm of colon: Secondary | ICD-10-CM | POA: Diagnosis not present

## 2023-12-18 DIAGNOSIS — Z1212 Encounter for screening for malignant neoplasm of rectum: Secondary | ICD-10-CM | POA: Diagnosis not present

## 2024-03-01 DIAGNOSIS — Z8679 Personal history of other diseases of the circulatory system: Secondary | ICD-10-CM | POA: Diagnosis not present

## 2024-03-01 DIAGNOSIS — R030 Elevated blood-pressure reading, without diagnosis of hypertension: Secondary | ICD-10-CM | POA: Diagnosis not present

## 2024-03-01 DIAGNOSIS — M7989 Other specified soft tissue disorders: Secondary | ICD-10-CM | POA: Diagnosis not present

## 2024-03-01 DIAGNOSIS — W57XXXA Bitten or stung by nonvenomous insect and other nonvenomous arthropods, initial encounter: Secondary | ICD-10-CM | POA: Diagnosis not present

## 2024-03-01 DIAGNOSIS — R600 Localized edema: Secondary | ICD-10-CM | POA: Diagnosis not present

## 2024-03-01 DIAGNOSIS — S20361A Insect bite (nonvenomous) of right front wall of thorax, initial encounter: Secondary | ICD-10-CM | POA: Diagnosis not present

## 2024-03-01 DIAGNOSIS — M79661 Pain in right lower leg: Secondary | ICD-10-CM | POA: Diagnosis not present

## 2024-03-06 DIAGNOSIS — Z96653 Presence of artificial knee joint, bilateral: Secondary | ICD-10-CM | POA: Diagnosis not present

## 2024-03-06 DIAGNOSIS — M25561 Pain in right knee: Secondary | ICD-10-CM | POA: Diagnosis not present

## 2024-03-12 DIAGNOSIS — R300 Dysuria: Secondary | ICD-10-CM | POA: Diagnosis not present

## 2024-03-12 DIAGNOSIS — N3001 Acute cystitis with hematuria: Secondary | ICD-10-CM | POA: Diagnosis not present

## 2024-07-11 DIAGNOSIS — Z2821 Immunization not carried out because of patient refusal: Secondary | ICD-10-CM | POA: Diagnosis not present

## 2024-07-11 DIAGNOSIS — I4891 Unspecified atrial fibrillation: Secondary | ICD-10-CM | POA: Diagnosis not present

## 2024-07-11 DIAGNOSIS — I4892 Unspecified atrial flutter: Secondary | ICD-10-CM | POA: Diagnosis not present

## 2024-07-16 DIAGNOSIS — I440 Atrioventricular block, first degree: Secondary | ICD-10-CM | POA: Diagnosis not present

## 2024-07-16 DIAGNOSIS — I4892 Unspecified atrial flutter: Secondary | ICD-10-CM | POA: Diagnosis not present
